# Patient Record
Sex: Female | Born: 1960 | Race: Black or African American | Hispanic: No | Marital: Married | Smoking: Never smoker
Health system: Southern US, Community
[De-identification: ages and names within clinical notes are randomized; demographics above are authoritative.]

## PROBLEM LIST (undated history)

## (undated) DIAGNOSIS — M1990 Unspecified osteoarthritis, unspecified site: Secondary | ICD-10-CM

## (undated) DIAGNOSIS — I10 Essential (primary) hypertension: Secondary | ICD-10-CM

## (undated) DIAGNOSIS — Z8489 Family history of other specified conditions: Secondary | ICD-10-CM

## (undated) HISTORY — PX: ABDOMINAL SURGERY: SHX537

## (undated) HISTORY — PX: CATARACT EXTRACTION: SUR2

## (undated) HISTORY — PX: KNEE ARTHROSCOPY: SUR90

## (undated) HISTORY — PX: ABDOMINAL HYSTERECTOMY: SHX81

---

## 2009-09-24 ENCOUNTER — Emergency Department (HOSPITAL_BASED_OUTPATIENT_CLINIC_OR_DEPARTMENT_OTHER): Admission: EM | Admit: 2009-09-24 | Discharge: 2009-09-24 | Payer: Self-pay | Admitting: Emergency Medicine

## 2009-09-26 ENCOUNTER — Emergency Department (HOSPITAL_BASED_OUTPATIENT_CLINIC_OR_DEPARTMENT_OTHER): Admission: EM | Admit: 2009-09-26 | Discharge: 2009-09-26 | Payer: Self-pay | Admitting: Emergency Medicine

## 2009-09-26 ENCOUNTER — Ambulatory Visit: Payer: Self-pay | Admitting: Radiology

## 2010-07-21 LAB — CULTURE, ROUTINE-ABSCESS

## 2014-11-27 DIAGNOSIS — K219 Gastro-esophageal reflux disease without esophagitis: Secondary | ICD-10-CM | POA: Diagnosis present

## 2014-11-27 DIAGNOSIS — I10 Essential (primary) hypertension: Secondary | ICD-10-CM | POA: Diagnosis present

## 2016-09-30 ENCOUNTER — Other Ambulatory Visit: Payer: Self-pay | Admitting: Neurosurgery

## 2016-10-14 ENCOUNTER — Encounter (HOSPITAL_COMMUNITY)
Admission: RE | Disposition: A | Discharge status: Home / Self Care | Payer: Self-pay | Source: Ambulatory Visit | Attending: Neurosurgery

## 2016-10-14 ENCOUNTER — Ambulatory Visit (HOSPITAL_COMMUNITY)
Admission: RE | Admit: 2016-10-14 | Discharge: 2016-10-14 | Disposition: A | Discharge status: Home / Self Care | Payer: PRIVATE HEALTH INSURANCE | Source: Ambulatory Visit | Attending: Neurosurgery | Admitting: Neurosurgery

## 2016-10-14 ENCOUNTER — Ambulatory Visit (HOSPITAL_COMMUNITY): Payer: PRIVATE HEALTH INSURANCE | Admitting: Anesthesiology

## 2016-10-14 ENCOUNTER — Encounter (HOSPITAL_COMMUNITY): Payer: Self-pay | Admitting: General Practice

## 2016-10-14 DIAGNOSIS — Z79890 Hormone replacement therapy (postmenopausal): Secondary | ICD-10-CM | POA: Diagnosis not present

## 2016-10-14 DIAGNOSIS — G5601 Carpal tunnel syndrome, right upper limb: Secondary | ICD-10-CM | POA: Diagnosis present

## 2016-10-14 DIAGNOSIS — Z7901 Long term (current) use of anticoagulants: Secondary | ICD-10-CM | POA: Diagnosis not present

## 2016-10-14 DIAGNOSIS — I10 Essential (primary) hypertension: Secondary | ICD-10-CM | POA: Insufficient documentation

## 2016-10-14 DIAGNOSIS — Z79899 Other long term (current) drug therapy: Secondary | ICD-10-CM | POA: Diagnosis not present

## 2016-10-14 HISTORY — DX: Family history of other specified conditions: Z84.89

## 2016-10-14 HISTORY — PX: CARPAL TUNNEL RELEASE: SHX101

## 2016-10-14 HISTORY — DX: Unspecified osteoarthritis, unspecified site: M19.90

## 2016-10-14 HISTORY — DX: Essential (primary) hypertension: I10

## 2016-10-14 LAB — CBC
HCT: 43.2 % (ref 36.0–46.0)
Hemoglobin: 14.2 g/dL (ref 12.0–15.0)
MCH: 27.8 pg (ref 26.0–34.0)
MCHC: 32.9 g/dL (ref 30.0–36.0)
MCV: 84.7 fL (ref 78.0–100.0)
Platelets: 259 10*3/uL (ref 150–400)
RBC: 5.1 MIL/uL (ref 3.87–5.11)
RDW: 13.4 % (ref 11.5–15.5)
WBC: 9 10*3/uL (ref 4.0–10.5)

## 2016-10-14 LAB — BASIC METABOLIC PANEL
Anion gap: 10 (ref 5–15)
BUN: 11 mg/dL (ref 6–20)
CO2: 25 mmol/L (ref 22–32)
Calcium: 9.2 mg/dL (ref 8.9–10.3)
Chloride: 104 mmol/L (ref 101–111)
Creatinine, Ser: 0.95 mg/dL (ref 0.44–1.00)
GFR calc Af Amer: >60 mL/min (ref >60)
GFR calc non Af Amer: >60 mL/min (ref >60)
Glucose, Bld: 109 mg/dL — ABNORMAL HIGH (ref 65–99)
Potassium: 3.7 mmol/L (ref 3.5–5.1)
Sodium: 139 mmol/L (ref 135–145)

## 2016-10-14 SURGERY — CARPAL TUNNEL RELEASE
Anesthesia: Monitor Anesthesia Care | Laterality: Right

## 2016-10-14 MED ORDER — CHLORHEXIDINE GLUCONATE CLOTH 2 % EX PADS: 6.0000 | MEDICATED_PAD | Freq: Once | CUTANEOUS | Status: DC

## 2016-10-14 MED ORDER — PROMETHAZINE HCL 25 MG/ML IJ SOLN: 6.2500 mg | INTRAMUSCULAR | Status: DC | PRN

## 2016-10-14 MED ORDER — FENTANYL CITRATE (PF) 250 MCG/5ML IJ SOLN
INTRAMUSCULAR | Status: AC
  Filled 2016-10-14: qty 5

## 2016-10-14 MED ORDER — MIDAZOLAM HCL 2 MG/2ML IJ SOLN
INTRAMUSCULAR | Status: AC
  Filled 2016-10-14: qty 2

## 2016-10-14 MED ORDER — BACITRACIN ZINC 500 UNIT/GM EX OINT
TOPICAL_OINTMENT | CUTANEOUS | Status: AC
  Filled 2016-10-14: qty 28.35

## 2016-10-14 MED ORDER — PROPOFOL 10 MG/ML IV BOLUS
INTRAVENOUS | Status: AC
  Filled 2016-10-14: qty 20

## 2016-10-14 MED ORDER — MIDAZOLAM HCL 2 MG/2ML IJ SOLN: 0.5000 mg | Freq: Once | INTRAMUSCULAR | Status: DC | PRN

## 2016-10-14 MED ORDER — MIDAZOLAM HCL 5 MG/5ML IJ SOLN
INTRAMUSCULAR | Status: DC | PRN
  Administered 2016-10-14: 2 mg via INTRAVENOUS

## 2016-10-14 MED ORDER — 0.9 % SODIUM CHLORIDE (POUR BTL) OPTIME
TOPICAL | Status: DC | PRN
  Administered 2016-10-14: 1000 mL

## 2016-10-14 MED ORDER — ONDANSETRON HCL 4 MG/2ML IJ SOLN
INTRAMUSCULAR | Status: DC | PRN
  Administered 2016-10-14: 4 mg via INTRAVENOUS

## 2016-10-14 MED ORDER — FENTANYL CITRATE (PF) 100 MCG/2ML IJ SOLN
INTRAMUSCULAR | Status: DC | PRN
  Administered 2016-10-14 (×2): 50 ug via INTRAVENOUS

## 2016-10-14 MED ORDER — MEPERIDINE HCL 25 MG/ML IJ SOLN: 6.2500 mg | INTRAMUSCULAR | Status: DC | PRN

## 2016-10-14 MED ORDER — ACETAMINOPHEN-CODEINE #3 300-30 MG PO TABS: 1.0000 | ORAL_TABLET | Freq: Four times a day (QID) | ORAL | 0 refills | Status: DC | PRN

## 2016-10-14 MED ORDER — FENTANYL CITRATE (PF) 100 MCG/2ML IJ SOLN
25.0000 ug | INTRAMUSCULAR | Status: DC | PRN
  Administered 2016-10-14: 25 ug via INTRAVENOUS

## 2016-10-14 MED ORDER — FENTANYL CITRATE (PF) 100 MCG/2ML IJ SOLN
INTRAMUSCULAR | Status: AC
  Filled 2016-10-14: qty 2

## 2016-10-14 MED ORDER — ONDANSETRON HCL 4 MG/2ML IJ SOLN
INTRAMUSCULAR | Status: AC
  Filled 2016-10-14: qty 2

## 2016-10-14 MED ORDER — BACITRACIN ZINC 500 UNIT/GM EX OINT
TOPICAL_OINTMENT | CUTANEOUS | Status: DC | PRN
  Administered 2016-10-14: 1 via TOPICAL

## 2016-10-14 MED ORDER — DEXAMETHASONE SODIUM PHOSPHATE 10 MG/ML IJ SOLN
INTRAMUSCULAR | Status: DC | PRN
  Administered 2016-10-14: 10 mg via INTRAVENOUS

## 2016-10-14 MED ORDER — LIDOCAINE-EPINEPHRINE 1 %-1:100000 IJ SOLN
INTRAMUSCULAR | Status: DC | PRN
  Administered 2016-10-14: 8 mL

## 2016-10-14 MED ORDER — LIDOCAINE HCL (PF) 0.5 % IJ SOLN
INTRAMUSCULAR | Status: AC
  Filled 2016-10-14: qty 50

## 2016-10-14 MED ORDER — LACTATED RINGERS IV SOLN
INTRAVENOUS | Status: DC | PRN
  Administered 2016-10-14: 08:00:00 via INTRAVENOUS

## 2016-10-14 MED ORDER — VANCOMYCIN HCL 1000 MG IV SOLR
INTRAVENOUS | Status: DC | PRN
  Administered 2016-10-14: 1000 mg via INTRAVENOUS

## 2016-10-14 MED ORDER — DEXAMETHASONE SODIUM PHOSPHATE 10 MG/ML IJ SOLN
INTRAMUSCULAR | Status: AC
  Filled 2016-10-14: qty 1

## 2016-10-14 MED ORDER — LIDOCAINE-EPINEPHRINE 1 %-1:100000 IJ SOLN
INTRAMUSCULAR | Status: AC
  Filled 2016-10-14: qty 1

## 2016-10-14 MED ORDER — PROPOFOL 500 MG/50ML IV EMUL
INTRAVENOUS | Status: DC | PRN
  Administered 2016-10-14: 100 ug/kg/min via INTRAVENOUS

## 2016-10-14 SURGICAL SUPPLY — 64 items
BANDAGE ACE 3X5.8 VEL STRL LF (GAUZE/BANDAGES/DRESSINGS) ×3 IMPLANT
BANDAGE GAUZE 4  KLING STR (GAUZE/BANDAGES/DRESSINGS) ×3 IMPLANT
BLADE SURG 15 STRL LF DISP TIS (BLADE) ×1 IMPLANT
BLADE SURG 15 STRL SS (BLADE) ×2
BNDG ELASTIC 2X5.8 VLCR STR LF (GAUZE/BANDAGES/DRESSINGS) ×3 IMPLANT
BNDG GAUZE ELAST 4 BULKY (GAUZE/BANDAGES/DRESSINGS) ×3 IMPLANT
CARTRIDGE OIL MAESTRO DRILL (MISCELLANEOUS) ×1 IMPLANT
CORDS BIPOLAR (ELECTRODE) ×3 IMPLANT
DECANTER SPIKE VIAL GLASS SM (MISCELLANEOUS) ×3 IMPLANT
DERMABOND ADVANCED (GAUZE/BANDAGES/DRESSINGS) ×2
DERMABOND ADVANCED .7 DNX12 (GAUZE/BANDAGES/DRESSINGS) ×1 IMPLANT
DIFFUSER DRILL AIR PNEUMATIC (MISCELLANEOUS) ×3 IMPLANT
DRAPE EXTREMITY T 121X128X90 (DRAPE) ×3 IMPLANT
DRAPE HALF SHEET 40X57 (DRAPES) ×3 IMPLANT
DURAPREP 26ML APPLICATOR (WOUND CARE) ×3 IMPLANT
GAUZE SPONGE 4X4 12PLY STRL (GAUZE/BANDAGES/DRESSINGS) ×3 IMPLANT
GAUZE SPONGE 4X4 16PLY XRAY LF (GAUZE/BANDAGES/DRESSINGS) ×3 IMPLANT
GLOVE BIO SURGEON STRL SZ 6.5 (GLOVE) IMPLANT
GLOVE BIO SURGEON STRL SZ7 (GLOVE) IMPLANT
GLOVE BIO SURGEON STRL SZ7.5 (GLOVE) IMPLANT
GLOVE BIO SURGEON STRL SZ8 (GLOVE) IMPLANT
GLOVE BIO SURGEON STRL SZ8.5 (GLOVE) IMPLANT
GLOVE BIO SURGEONS STRL SZ 6.5 (GLOVE)
GLOVE BIOGEL M 8.0 STRL (GLOVE) IMPLANT
GLOVE ECLIPSE 6.5 STRL STRAW (GLOVE) ×3 IMPLANT
GLOVE ECLIPSE 7.0 STRL STRAW (GLOVE) IMPLANT
GLOVE ECLIPSE 7.5 STRL STRAW (GLOVE) IMPLANT
GLOVE ECLIPSE 8.0 STRL XLNG CF (GLOVE) IMPLANT
GLOVE ECLIPSE 8.5 STRL (GLOVE) IMPLANT
GLOVE EXAM NITRILE LRG STRL (GLOVE) IMPLANT
GLOVE EXAM NITRILE XL STR (GLOVE) IMPLANT
GLOVE EXAM NITRILE XS STR PU (GLOVE) IMPLANT
GLOVE INDICATOR 6.5 STRL GRN (GLOVE) IMPLANT
GLOVE INDICATOR 7.0 STRL GRN (GLOVE) ×3 IMPLANT
GLOVE INDICATOR 7.5 STRL GRN (GLOVE) ×6 IMPLANT
GLOVE INDICATOR 8.0 STRL GRN (GLOVE) IMPLANT
GLOVE INDICATOR 8.5 STRL (GLOVE) IMPLANT
GLOVE OPTIFIT SS 8.0 STRL (GLOVE) IMPLANT
GLOVE SS BIOGEL STRL SZ 7 (GLOVE) ×3 IMPLANT
GLOVE SUPERSENSE BIOGEL SZ 7 (GLOVE) ×6
GLOVE SURG SS PI 6.5 STRL IVOR (GLOVE) IMPLANT
GLOVE SURG SS PI 7.0 STRL IVOR (GLOVE) ×6 IMPLANT
GOWN STRL REUS W/ TWL LRG LVL3 (GOWN DISPOSABLE) ×2 IMPLANT
GOWN STRL REUS W/ TWL XL LVL3 (GOWN DISPOSABLE) ×1 IMPLANT
GOWN STRL REUS W/TWL 2XL LVL3 (GOWN DISPOSABLE) IMPLANT
GOWN STRL REUS W/TWL LRG LVL3 (GOWN DISPOSABLE) ×4
GOWN STRL REUS W/TWL XL LVL3 (GOWN DISPOSABLE) ×2
KIT BASIN OR (CUSTOM PROCEDURE TRAY) ×3 IMPLANT
KIT ROOM TURNOVER OR (KITS) ×3 IMPLANT
NEEDLE HYPO 25X1 1.5 SAFETY (NEEDLE) ×3 IMPLANT
NS IRRIG 1000ML POUR BTL (IV SOLUTION) ×3 IMPLANT
OIL CARTRIDGE MAESTRO DRILL (MISCELLANEOUS) ×3
PACK SURGICAL SETUP 50X90 (CUSTOM PROCEDURE TRAY) ×3 IMPLANT
PAD ARMBOARD 7.5X6 YLW CONV (MISCELLANEOUS) ×9 IMPLANT
STOCKINETTE 4X48 STRL (DRAPES) ×3 IMPLANT
SUT ETHILON 3 0 PS 1 (SUTURE) ×3 IMPLANT
SYR BULB 3OZ (MISCELLANEOUS) ×3 IMPLANT
SYR CONTROL 10ML LL (SYRINGE) ×3 IMPLANT
TOWEL GREEN STERILE (TOWEL DISPOSABLE) ×3 IMPLANT
TOWEL GREEN STERILE FF (TOWEL DISPOSABLE) ×3 IMPLANT
TUBE CONNECTING 12'X1/4 (SUCTIONS) ×1
TUBE CONNECTING 12X1/4 (SUCTIONS) ×2 IMPLANT
UNDERPAD 30X30 (UNDERPADS AND DIAPERS) ×3 IMPLANT
WATER STERILE IRR 1000ML POUR (IV SOLUTION) ×3 IMPLANT

## 2016-10-14 NOTE — H&P (Signed)
Lynn Hodges is a 56 y.o. female Who presents with right carpal tunnel syndrome.  Allergies  Allergen Reactions  . Penicillins Nausea And Vomiting and Rash   Past Medical History:  Diagnosis Date  . Arthritis   . Family history of adverse reaction to anesthesia    malignant hyperthermia with 2 cousins and mother's sister  . Hypertension    Past Surgical History:  Procedure Laterality Date  . ABDOMINAL HYSTERECTOMY    . ABDOMINAL SURGERY    . CATARACT EXTRACTION Right   . CESAREAN SECTION     x3  . KNEE ARTHROSCOPY Left    History reviewed. No pertinent family history. Social History   Social History  . Marital status: Married    Spouse name: N/A  . Number of children: N/A  . Years of education: N/A   Occupational History  . Not on file.   Social History Main Topics  . Smoking status: Never Smoker  . Smokeless tobacco: Never Used  . Alcohol use Not on file  . Drug use: Unknown  . Sexual activity: Not on file   Other Topics Concern  . Not on file   Social History Narrative  . No narrative on file   Physical Exam  Constitutional: She is oriented to person, place, and time. She appears well-developed and well-nourished. No distress.  HENT:  Head: Normocephalic and atraumatic.  Right Ear: External ear normal.  Left Ear: External ear normal.  Eyes: Conjunctivae are normal. Pupils are equal, round, and reactive to light.  Right lateral strabismus right eye  Neck: Normal range of motion. Neck supple.  Cardiovascular: Normal rate and regular rhythm.   Pulmonary/Chest: Effort normal and breath sounds normal.  Abdominal: Soft. Bowel sounds are normal.  Musculoskeletal: Normal range of motion.  Neurological: She is oriented to person, place, and time. She displays normal reflexes. A sensory deficit is present. No cranial nerve deficit. She exhibits normal muscle tone. Coordination normal.  Skin: Skin is warm and dry.  Psychiatric: She has a normal mood and affect.  Her behavior is normal. Judgment and thought content normal.   A/p  Admit for carpal tunnel release. Right side Risks and benefits bleeding infection no relief, need for further surgery, nerve damage, hand weakness, and other risks were discussed. She understands and wishes to proceed.

## 2016-10-14 NOTE — Anesthesia Postprocedure Evaluation (Signed)
Anesthesia Post Note  Patient: FALAN HENSLER  Procedure(s) Performed: Procedure(s) (LRB): CARPAL TUNNEL RELEASE (Right)     Patient location during evaluation: PACU Anesthesia Type: MAC Level of consciousness: awake and alert and patient cooperative Pain management: pain level controlled Vital Signs Assessment: post-procedure vital signs reviewed and stable Respiratory status: spontaneous breathing, nonlabored ventilation and respiratory function stable Cardiovascular status: blood pressure returned to baseline and stable Postop Assessment: no signs of nausea or vomiting Anesthetic complications: no Comments: Discussed need for MH testing with patient and her family.  She understands the importance of testing for her family. Contact info for the Dept of Anesthesiology at [facility], which is a testing site, given to patient and her family. All questions answered      Last Vitals:  Vitals:   10/14/16 1113 10/14/16 1116  BP:  (!) 156/83  Pulse: 72 72  Resp: (!) 21 19  Temp: (!) 36.1 C     Last Pain:  Vitals:   10/14/16 1103  TempSrc:   PainSc: 6                  ,E. 

## 2016-10-14 NOTE — Anesthesia Preprocedure Evaluation (Signed)
Anesthesia Evaluation  Patient identified by MRN, date of birth, ID band Patient awake    Reviewed: Allergy & Precautions, NPO status , Patient's Chart, lab work & pertinent test results  History of Anesthesia Complications (+) MALIGNANT HYPERTHERMIA, Family history of anesthesia reaction and history of anesthetic complications (2 maternal cousins have had MH)  Airway Mallampati: I  TM Distance: >3 FB Neck ROM: Full    Dental  (+) Dental Advisory Given   Pulmonary neg pulmonary ROS,    breath sounds clear to auscultation       Cardiovascular hypertension, Pt. on medications (-) anginanegative cardio ROS   Rhythm:Regular Rate:Normal     Neuro/Psych negative neurological ROS     GI/Hepatic Neg liver ROS, GERD  Medicated and Controlled,  Endo/Other  Morbid obesity  Renal/GU negative Renal ROS     Musculoskeletal   Abdominal (+) + obese,   Peds  Hematology   Anesthesia Other Findings   Reproductive/Obstetrics                             Anesthesia Physical Anesthesia Plan  ASA: II  Anesthesia Plan: MAC   Post-op Pain Management:    Induction: Intravenous  PONV Risk Score and Plan: 2 and Ondansetron and Dexamethasone  Airway Management Planned: Natural Airway and Nasal Cannula  Additional Equipment:   Intra-op Plan:   Post-operative Plan:   Informed Consent: I have reviewed the patients History and Physical, chart, labs and discussed the procedure including the risks, benefits and alternatives for the proposed anesthesia with the patient or authorized representative who has indicated his/her understanding and acceptance.   Dental advisory given  Plan Discussed with: CRNA and Surgeon  Anesthesia Plan Comments: (Plan routine monitors, MAC)        Anesthesia Quick Evaluation

## 2016-10-14 NOTE — Discharge Instructions (Signed)
Pt to take compression wrap off tommorow, take gauze off on Friday then may get wet but do not summerge in water,  Make follow up appt for 10 days       Carpal Tunnel Release (Repair)  Care After    Refer to this sheet in the next few weeks. These discharge instructions provide you with general information on caring for yourself after you leave the hospital. Your caregiver may also give you specific instructions. Your treatment has been planned according to the most current medical practices available, but unavoidable complications sometimes occur. If you have any problems or questions after discharge, please call your caregiver.  HOME CARE INSTRUCTIONS  Have a responsible person with you for 24 hours.  Do not drive a car or take public transportation for 24 hours.  Only take over-the-counter or prescription medicines for pain, discomfort, or fever as directed by your caregiver. Take them as directed.  You may put ice on the palm side of the affected wrist.  Put ice in a plastic bag.  Place a towel between your skin and the bag.  Leave the ice on for 15 to 20 minutes, 3 to 4 times per day.  Remove the Ace bandage approximately 6pm the day of surgery Keep your hand raised (elevated) above the level of your heart as much as possible. This keeps swelling down and helps with discomfort.  Change bandages (dressings) as directed.  Keep the wound clean and dry. SEEK MEDICAL CARE IF:  You develop pain not relieved with medicines.  You develop numbness of your hand.  You develop bleeding from your surgical site.  You have an oral temperature above 102 F (38.9 C).  You develop redness or swelling of the surgical site.  You develop new, unexplained problems. SEEK IMMEDIATE MEDICAL CARE IF:  You develop a rash.  You have difficulty breathing.  You develop any reaction or side effects to medicines given. MAKE SURE YOU:  Understand these instructions.  Will watch your condition.  Will get  help right away if you are not doing well or get worse.

## 2016-10-14 NOTE — Transfer of Care (Signed)
Immediate Anesthesia Transfer of Care Note  Patient: Lynn Hodges  Procedure(s) Performed: Procedure(s) with comments: CARPAL TUNNEL RELEASE (Right) - CARPAL TUNNEL RELEASE  Patient Location: PACU  Anesthesia Type:MAC  Level of Consciousness: awake, alert , oriented and patient cooperative  Airway & Oxygen Therapy: Patient Spontanous Breathing and Patient connected to nasal cannula oxygen  Post-op Assessment: Report given to RN and Post -op Vital signs reviewed and stable  Post vital signs: Reviewed and stable  Last Vitals:  Vitals:   10/14/16 0715 10/14/16 0717  BP: (!) 189/111 (!) 170/87  Pulse: 73   Resp: 18   Temp: 36.8 C     Last Pain:  Vitals:   10/14/16 0715  TempSrc: Oral  PainSc:       Patients Stated Pain Goal: 2 (17/61/60 7371)  Complications: No apparent anesthesia complications

## 2016-10-14 NOTE — Op Note (Signed)
   10:36 AM  PATIENT:  Lynn Hodges  56 y.o. female  PRE-OPERATIVE DIAGNOSIS:  Right Carpal Tunnel Syndrome  POST-OPERATIVE DIAGNOSIS:  Right Carpal Tunnel Syndrome  PROCEDURE:  Procedure(s):right CARPAL TUNNEL RELEASE  SURGEON: Surgeon(s): Coletta Memosabbell, , MD  ANESTHESIA:   local and IV sedation  EBL:  Total I/O In: 950 [I.V.:700; IV Piggyback:250] Out: 10 [Blood:10]  COUNT:per nursing  DICTATION: Lynn FootJeannie M Strick was taken to the operating room, given IV sedation, and positioned on the operating room table. female had their right upper extremity prepped and draped in a sterile manner. I infiltrated Licodcaine  1/2% 1/200,000 strength epinephrine into the planned incision starting at the proximal palmar crease extending into the hand ~ 1.5cm. I opened the skin with a 15 blade and extended the incision through the skin into the subcutaneous tissue. I used the bipolar cautery to control the subcutaneous bleeding. I dissected sharply through the tissue using forceps also to expose the transverse carpal ligament. I dived the transverse carpal ligament sharply with the 15 blade using the forceps to protect the contents of the carpal tunnel. With the scissors I divided the ligament both proximally and distally to decompress the entire carpal tunnel. I used the scissors to dissect into the forearm to create space to divide the ligament to the proximal palmar crease, and distally into the palm.  I irrigated the wound then closed the incision with vertical interrupted vertical mattress sutures. I placed a sterile dressing, then wrapped the proximal hand and distal forearm with an ace wrap.  PLAN OF CARE: Discharge to home after PACU  PATIENT DISPOSITION:  PACU - hemodynamically stable.   Delay start of Pharmacological VTE agent (>24hrs) due to surgical blood loss or risk of bleeding:  yes

## 2016-10-14 NOTE — Progress Notes (Signed)
Dr. Jean RosenthalJackson notified of family history of malignant hyperthermia on mother's side.  Dr. Jean RosenthalJackson made aware and asked nurse to [facility].  Unable to reach [facility].  Carol AdaKristie [facility], charge CRNA aware, anesthesia tech made aware and setting up OR.

## 2016-10-15 ENCOUNTER — Encounter (HOSPITAL_COMMUNITY): Payer: Self-pay | Admitting: Neurosurgery

## 2016-11-26 ENCOUNTER — Telehealth: Payer: Self-pay | Admitting: Physical Therapy

## 2016-11-26 NOTE — Telephone Encounter (Signed)
Patient calling back RE scheduling new pt appt with Judeth CornfieldStephanie for PT.  Say's she will call back to sched on her lunch break.  Thank you,  -LL

## 2016-11-30 ENCOUNTER — Ambulatory Visit (INDEPENDENT_AMBULATORY_CARE_PROVIDER_SITE_OTHER): Payer: PRIVATE HEALTH INSURANCE | Admitting: Physical Therapy

## 2016-11-30 DIAGNOSIS — M25631 Stiffness of right wrist, not elsewhere classified: Secondary | ICD-10-CM | POA: Diagnosis not present

## 2016-11-30 DIAGNOSIS — M6281 Muscle weakness (generalized): Secondary | ICD-10-CM | POA: Diagnosis not present

## 2016-11-30 DIAGNOSIS — M25531 Pain in right wrist: Secondary | ICD-10-CM | POA: Diagnosis not present

## 2016-11-30 NOTE — Therapy (Signed)
[facility] [address] [place], [place], [address] Phone: [phone]   Fax:  [phone]  Physical Therapy Evaluation  Patient Details  Name: Lynn Hodges MRN: [id number] Date of Birth: 10/20/1960 Referring Provider: Dr. Coletta MemosKyle Cabbell  Encounter Date: 11/30/2016      PT End of Session - 11/30/16 0931    Visit Number 1   Number of Visits 4   Date for PT Re-Evaluation 12/28/16   PT Start Time 0855   PT Stop Time 0925   PT Time Calculation (min) 30 min   Activity Tolerance Patient tolerated treatment well   Behavior During Therapy [facility] for tasks assessed/performed      Past Medical History:  Diagnosis Date  . Arthritis   . Family history of adverse reaction to anesthesia    malignant hyperthermia with 2 cousins and mother's sister  . Hypertension     Past Surgical History:  Procedure Laterality Date  . ABDOMINAL HYSTERECTOMY    . ABDOMINAL SURGERY    . CARPAL TUNNEL RELEASE Right 10/14/2016   Procedure: CARPAL TUNNEL RELEASE;  Surgeon: Coletta Memosabbell, Kyle, MD;  [facility];  Service: Neurosurgery;  Laterality: Right;  CARPAL TUNNEL RELEASE  . CATARACT EXTRACTION Right   . CESAREAN SECTION     x3  . KNEE ARTHROSCOPY Left     There were no vitals filed for this visit.       Subjective Assessment - 11/30/16 0858    Subjective Pt is a 56 y/o female who presents to OPPT s/p Rt carpal tunnel release on 10/14/16.  Pt presents today with continued pain most consistent with activities including ulnar deviation as well as pronation/supination.   Patient is accompained by: Family member  son   Pertinent History arthritis, HTN   Limitations House hold activities   Patient Stated Goals improve pain and function   Currently in Pain? Yes   Pain Score 5    Pain Location Hand   Pain Orientation Right   Pain Descriptors / Indicators Numbness;Shooting;Tightness   Pain Type Surgical pain;Chronic pain;Neuropathic pain   Pain Onset  More than a month ago   Pain Frequency Intermittent   Aggravating Factors  opening jars/bottles, stirring            [facility] PT Assessment - 11/30/16 0902      Assessment   Medical Diagnosis Rt CTR   Referring Provider Dr. Coletta MemosKyle Cabbell   Onset Date/Surgical Date 10/14/16   Hand Dominance Right   Next MD Visit 12/31/16   Prior Therapy none for this condition     Precautions   Precaution Comments soft wrist orthosis; no lifting > 10#     Restrictions   Weight Bearing Restrictions No     Balance Screen   Has the patient fallen in the past 6 months No   Has the patient had a decrease in activity level because of a fear of falling?  No   Is the patient reluctant to leave their home because of a fear of falling?  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   Leisure sleep, shop, church     Cognition  Overall Cognitive Status Within Functional Limits for tasks assessed     Posture/Postural Control   Posture/Postural Control Postural limitations   Postural Limitations Rounded Shoulders;Forward head     AROM   AROM Assessment Site Wrist   Right/Left Wrist Right   Right Wrist Extension 50 Degrees   Right Wrist Flexion 60 Degrees   Right Wrist Radial Deviation 16 Degrees   Right Wrist Ulnar Deviation 46 Degrees     Strength   Strength Assessment Site Wrist;Hand;Forearm   Right/Left Forearm Right   Right Forearm Pronation 3+/5   Right Forearm Supination 3+/5   Right/Left Wrist Right   Right Wrist Flexion 3-/5   Right Wrist Extension 3+/5   Right Wrist Radial Deviation 5/5   Right Wrist Ulnar Deviation 3/5   Right/Left hand Right;Left   Right  Hand Grip (lbs) 17.67  24, 15, 14   Left Hand Grip (lbs) 56.33  69, 55, 45     Palpation   Palpation comment mild tenderness at Rt wrist joint ant/post; swelling noted Rt wrist            Objective measurements completed on examination: See above findings.          [facility] Adult PT Treatment/Exercise - 11/30/16 0902      Exercises   Exercises Wrist;Hand     Hand Exercises   Thumb Opposition to all digits x 5 reps on Rt   Other Hand Exercises towel squeeze 5x5 sec; open/close fist for edema management     Wrist Exercises   Other wrist exercises prayer and reverse prayer stretch x 30 sec each for HEP instruction                PT Education - 11/30/16 0931    Education provided Yes   Education Details HEP   Person(s) Educated Patient;Child(ren)   Methods Explanation;Demonstration;Handout   Comprehension Verbalized understanding;Returned demonstration             PT Long Term Goals - 11/30/16 1058      PT LONG TERM GOAL #1   Title independent with HEP    Time 4   Period Weeks   Status New   Target Date 12/28/16     PT LONG TERM GOAL #2   Title report pain < 3/10 with activity for improved work tolerance   Time 4   Period Weeks   Status New   Target Date 12/28/16     PT LONG TERM GOAL #3   Title improve Rt grip strength to > 20# for improved strength and ADLs   Time 4   Period Weeks   Status New   Target Date 12/28/16                Plan - 11/30/16 1056    Clinical Impression Statement Pt is a 56 y/o female who presents to OPPT s/p Rt CTR.  Pt demonstrates decreased strength and ROM, pain and swelling affecting functional mobility.  Pt will benefit from PT to address deficits listed.   Clinical Presentation Stable   Clinical Decision Making Low   Rehab Potential Good   PT Frequency 1x / week   PT Duration 4 weeks   PT Treatment/Interventions ADLs/Self Care Home Management;Cryotherapy;Moist Heat;Ultrasound;Therapeutic  exercise;Therapeutic activities;Functional mobility training;Patient/family education;Manual techniques;Scar mobilization;Taping;Dry needling;Passive range of motion   PT Next Visit Plan review HEP, manual PRN for motion, add strengthening exercises to HEP   Consulted and Agree with Plan of Care Patient;Family member/caregiver  Family Member Consulted son      Patient will benefit from skilled therapeutic intervention in order to improve the following deficits and impairments:  Decreased range of motion, Decreased strength, Pain, Impaired UE functional use, Increased edema, Increased muscle spasms  Visit Diagnosis: Pain in right wrist - Plan: PT plan of care cert/re-cert  Stiffness of right wrist, not elsewhere classified - Plan: PT plan of care cert/re-cert  Muscle weakness (generalized) - Plan: PT plan of care cert/re-cert     Problem List There are no active problems to display for this patient.    Clarita Crane, PT, DPT 11/30/16 11:04 AM   [facility] [address] [place], [place], [address] Phone: [phone]   Fax:  [phone]  Name: Lynn Hodges MRN: [id number] Date of Birth: 1960-11-01

## 2016-12-07 ENCOUNTER — Ambulatory Visit (INDEPENDENT_AMBULATORY_CARE_PROVIDER_SITE_OTHER): Payer: PRIVATE HEALTH INSURANCE | Admitting: Physical Therapy

## 2016-12-07 DIAGNOSIS — M25531 Pain in right wrist: Secondary | ICD-10-CM | POA: Diagnosis not present

## 2016-12-07 DIAGNOSIS — M6281 Muscle weakness (generalized): Secondary | ICD-10-CM

## 2016-12-07 DIAGNOSIS — M25631 Stiffness of right wrist, not elsewhere classified: Secondary | ICD-10-CM | POA: Diagnosis not present

## 2016-12-07 NOTE — Therapy (Addendum)
[place] [address] [place], [place], [address] Phone: [phone]   Fax:  [phone]  Physical Therapy Treatment/Discharge  Patient Details  Name: Lynn Hodges MRN: [id number] Date of Birth: 1960/12/11 Referring Provider: Dr. Ashok Pall  Encounter Date: 12/07/2016      PT End of Session - 12/07/16 1338    Visit Number 2   Number of Visits 4   Date for PT Re-Evaluation 12/28/16   PT Start Time 5462   PT Stop Time 1334   PT Time Calculation (min) 39 min   Activity Tolerance Patient tolerated treatment well   Behavior During Therapy [facility] for tasks assessed/performed      Past Medical History:  Diagnosis Date  . Arthritis   . Family history of adverse reaction to anesthesia    malignant hyperthermia with 2 cousins and mother's sister  . Hypertension     Past Surgical History:  Procedure Laterality Date  . ABDOMINAL HYSTERECTOMY    . ABDOMINAL SURGERY    . CARPAL TUNNEL RELEASE Right 10/14/2016   Procedure: CARPAL TUNNEL RELEASE;  Surgeon: Ashok Pall, MD;  Location: [facility];  Service: Neurosurgery;  Laterality: Right;  CARPAL TUNNEL RELEASE  . CATARACT EXTRACTION Right   . CESAREAN SECTION     x3  . KNEE ARTHROSCOPY Left     There were no vitals filed for this visit.      Subjective Assessment - 12/07/16 1258    Subjective still having difficulty with Rt thumb, but otherwise doing well.  Wrist is feeling better.   Patient Stated Goals improve pain and function   Currently in Pain? Yes   Pain Score 4    Pain Location Finger (Comment which one)  thumb   Pain Orientation Right   Pain Descriptors / Indicators Tender;Numbness;Sore   Pain Type Surgical pain;Chronic pain;Neuropathic pain   Pain Onset More than a month ago   Pain Frequency Intermittent   Aggravating Factors  stirring   Pain Relieving Factors ibuprofen                         [facility] Adult PT Treatment/Exercise - 12/07/16 1335       Elbow Exercises   Forearm Supination Right;15 reps;Bar weights/barbell  pronation/supination x 15 reps each; Rt; 2#   Wrist Flexion Right;15 reps;Bar weights/barbell   Bar Weights/Barbell (Wrist Flexion) 2 lbs   Wrist Extension Right;15 reps;Bar weights/barbell   Bar Weights/Barbell (Wrist Extension) 2 lbs     Hand Exercises   Rubberbands thumb extension x 15; digit extension x 15   Other Hand Exercises ASL alphabet x 1 rep through     Wrist Exercises   Wrist Radial Deviation Right;15 reps;Bar weights/barbell   Bar Weights/Barbell (Radial Deviation) 2 lbs   Wrist Ulnar Deviation Right;15 reps;Bar weights/barbell   Bar Weights/Barbell (Ulnar Deviation) 2 lbs     Manual Therapy   Manual Therapy Edema management;Soft tissue mobilization;Passive ROM   Edema Management Rt hand/wrist   Soft tissue mobilization Rt wrist flexors and intrinsic thumb muscles   Passive ROM Rt wrist all directions                PT Education - 12/07/16 1338    Education provided Yes   Education Details strengthening HEP   Person(s) Educated Patient;Child(ren)   Methods Explanation;Demonstration;Handout   Comprehension Verbalized understanding;Returned demonstration             PT Long Term Goals -  12/07/16 1339      PT LONG TERM GOAL #1   Title independent with HEP    Baseline 12/07/16: independent with initial HEP, new HEP given today   Status On-going     PT LONG TERM GOAL #2   Title report pain < 3/10 with activity for improved work tolerance   Status On-going     PT LONG TERM GOAL #3   Title improve Rt grip strength to > 20# for improved strength and ADLs   Status On-going               Plan - 12/07/16 1339    Clinical Impression Statement Pt independent with ROM HEP and initiated strengthening exercises today.  Tolerated session well with min c/o muscle fatigue with strengthening.  Continues to have some tightness and tenderness in intrinsic thumb muscles and  wrist flexors, and educated on scar mobilization.  Will plan to see next week, check goals and possibly hold PT if pt doing well.   PT Treatment/Interventions ADLs/Self Care Home Management;Cryotherapy;Moist Heat;Ultrasound;Therapeutic exercise;Therapeutic activities;Functional mobility training;Patient/family education;Manual techniques;Scar mobilization;Taping;Dry needling;Passive range of motion   PT Next Visit Plan review HEP, manual PRN for motion, check grip strength.  pt may request to hold PT x 30 days   Consulted and Agree with Plan of Care Patient;Family member/caregiver   Family Member Consulted son      Patient will benefit from skilled therapeutic intervention in order to improve the following deficits and impairments:  Decreased range of motion, Decreased strength, Pain, Impaired UE functional use, Increased edema, Increased muscle spasms  Visit Diagnosis: Pain in right wrist  Stiffness of right wrist, not elsewhere classified  Muscle weakness (generalized)     Problem List There are no active problems to display for this patient.     Laureen Abrahams, PT, DPT 12/07/16 1:44 PM    [place], [place], [address] Phone: [phone]   Fax:  [phone]  Name: Lynn Hodges MRN: [id number] Date of Birth: Feb 23, 1961       PHYSICAL THERAPY DISCHARGE SUMMARY  Visits from Start of Care: 2  Current functional level related to goals / functional outcomes: See above   Remaining deficits: Unknown; pt didn't return after 2nd visit.     Education / Equipment: HEP  Plan: Patient agrees to discharge.  Patient goals were not met. Patient is being discharged due to not returning since the last visit.  ?????     Laureen Abrahams, PT, DPT 02/01/17 7:47 AM   [place] [address] [place], [place], [address] Phone: [phone]  Fax:  [phone]

## 2016-12-07 NOTE — Patient Instructions (Signed)
Flexion (Resistive)    With hand palm-up and holding _16-32___ ounces, bend hand toward you at wrist. Hold __1-2__ seconds. Relax slowly. Repeat _15___ times. Do _1-2___ sessions per day.   Extension (Resistive)    With wrist over edge of table, lift _16-32___ ounces, keeping arm on table surface. Hold __1-2__ seconds. Lower slowly. Repeat __15__ times. Do __1-2__ sessions per day.   Forearm Pronation / Supination: Resisted (Sitting)    With right forearm supported, grasp object and gently rotate palm up, then down, as far as possible without pain. Repeat _15___ times per set. Do __1__ sets per session. Do __1-2__ sessions per day.                  Deviation, Radial: Hammer    Left forearm on thigh, wrist extending off knee, thumb up, hold one end of __1-2__ lb weight. Lower front end of weight as if hammering. Repeat __15__ times per set. Do __1__ sets per session. Do __1-2__ sessions per week.   Finger Extension / Thumb Abduction: Resisted    With rubber band around right thumb and _all 4_ fingers, hand slightly cupped, gently spread thumb and fingers apart.  Also perform just moving the thumb away.   Repeat __15__ times per set. Do __1__ sets per session. Do _1-2___ sessions per day.

## 2017-06-15 ENCOUNTER — Ambulatory Visit (INDEPENDENT_AMBULATORY_CARE_PROVIDER_SITE_OTHER): Payer: PRIVATE HEALTH INSURANCE

## 2017-06-15 ENCOUNTER — Other Ambulatory Visit: Payer: Self-pay | Admitting: Neurosurgery

## 2017-06-15 DIAGNOSIS — R2231 Localized swelling, mass and lump, right upper limb: Secondary | ICD-10-CM

## 2017-06-15 DIAGNOSIS — M25541 Pain in joints of right hand: Secondary | ICD-10-CM

## 2018-03-21 DIAGNOSIS — M4802 Spinal stenosis, cervical region: Secondary | ICD-10-CM | POA: Insufficient documentation

## 2018-04-05 DIAGNOSIS — F411 Generalized anxiety disorder: Secondary | ICD-10-CM | POA: Diagnosis present

## 2018-07-03 DIAGNOSIS — M5030 Other cervical disc degeneration, unspecified cervical region: Secondary | ICD-10-CM | POA: Insufficient documentation

## 2018-07-03 DIAGNOSIS — G894 Chronic pain syndrome: Secondary | ICD-10-CM | POA: Insufficient documentation

## 2018-07-03 DIAGNOSIS — M4712 Other spondylosis with myelopathy, cervical region: Secondary | ICD-10-CM | POA: Insufficient documentation

## 2018-09-29 IMAGING — DX DG HAND 2V*R*
2 series · 2 of 2 positions shown · non-contrast
Comparison: None.

CLINICAL DATA: 56-year-old female with a history of pain at the
base of the right thumb into the wrist.

EXAM:
RIGHT HAND - 2 VIEW

[hand pa]
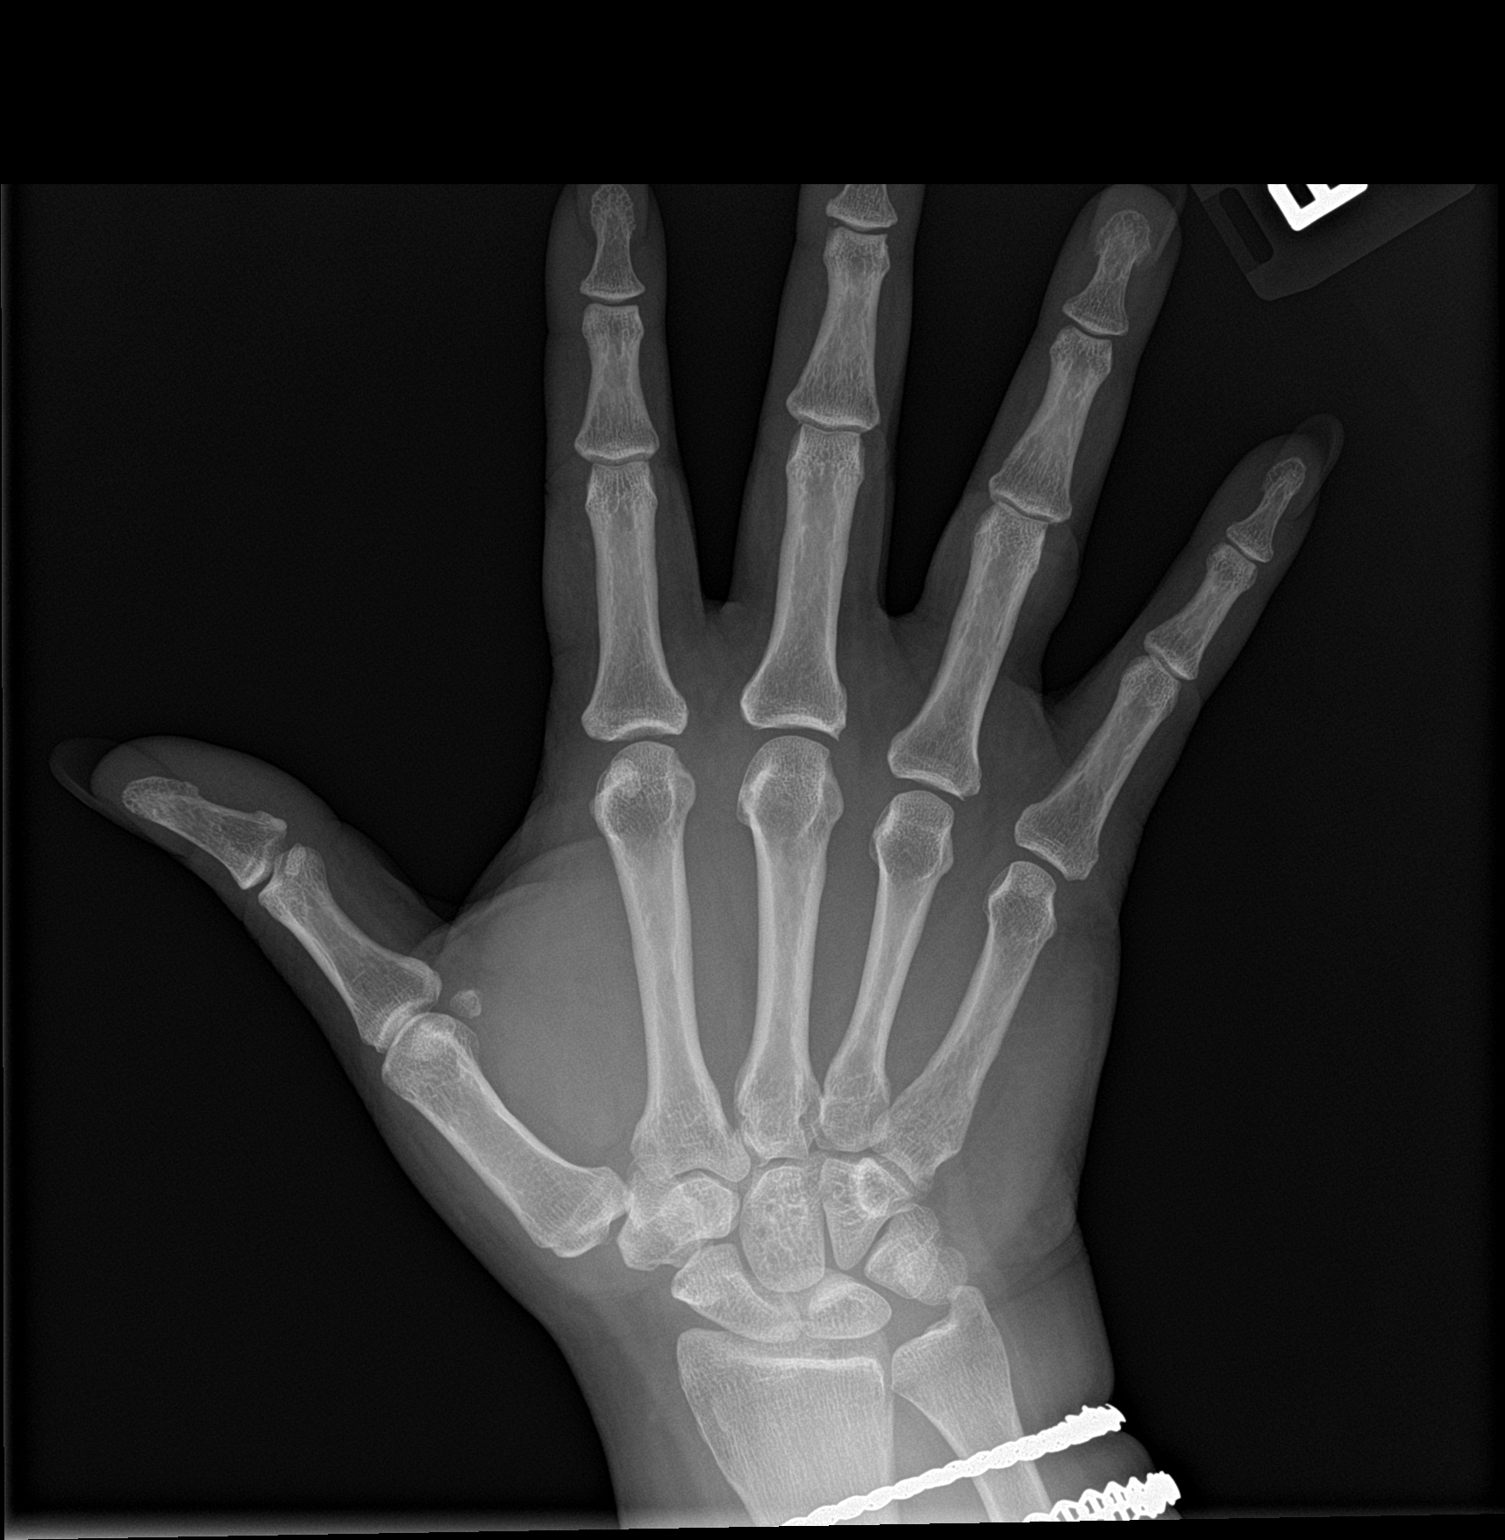

[hand lat]
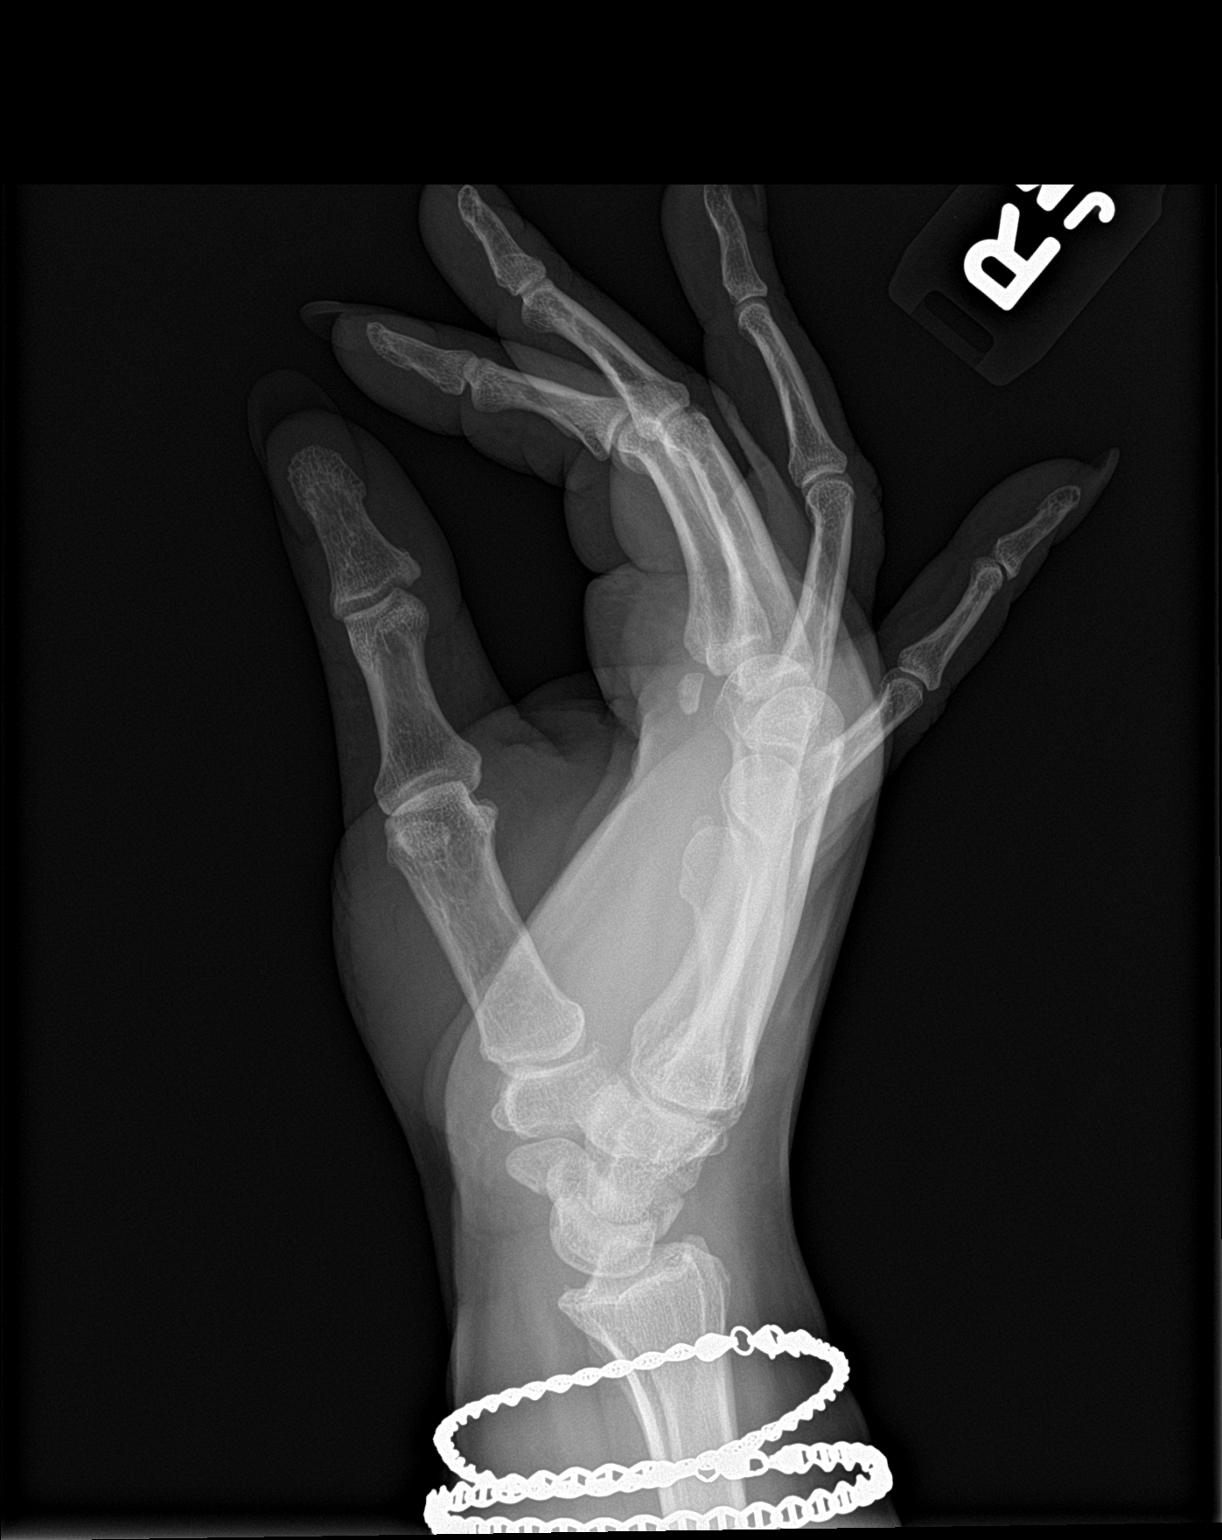

[2 of 2 positions shown; findings below may reference images not displayed]

FINDINGS: No acute displaced fracture. No significant degenerative changes. No
periarticular osteopenia. No subluxation/dislocation. No erosive
changes. Minimal degenerative changes at the first carpometacarpal
joint. No focal soft tissue swelling. No radiopaque foreign body.
IMPRESSION: Negative for acute bony abnormality.

Minimal degenerative changes at the first carpometacarpal joint.

## 2019-11-30 DIAGNOSIS — I252 Old myocardial infarction: Secondary | ICD-10-CM | POA: Insufficient documentation

## 2019-11-30 DIAGNOSIS — E785 Hyperlipidemia, unspecified: Secondary | ICD-10-CM | POA: Diagnosis present

## 2019-11-30 DIAGNOSIS — I2510 Atherosclerotic heart disease of native coronary artery without angina pectoris: Secondary | ICD-10-CM | POA: Diagnosis present

## 2019-12-13 DIAGNOSIS — Z955 Presence of coronary angioplasty implant and graft: Secondary | ICD-10-CM

## 2022-12-01 ENCOUNTER — Other Ambulatory Visit: Payer: Self-pay

## 2022-12-01 ENCOUNTER — Inpatient Hospital Stay (HOSPITAL_BASED_OUTPATIENT_CLINIC_OR_DEPARTMENT_OTHER)
Admission: EM | Admit: 2022-12-01 | Discharge: 2022-12-04 | DRG: 177 | Disposition: A | Discharge status: Home / Self Care | Payer: Managed Care, Other (non HMO) | Attending: Internal Medicine | Admitting: Internal Medicine

## 2022-12-01 ENCOUNTER — Emergency Department (HOSPITAL_BASED_OUTPATIENT_CLINIC_OR_DEPARTMENT_OTHER): Payer: Managed Care, Other (non HMO)

## 2022-12-01 ENCOUNTER — Encounter (HOSPITAL_BASED_OUTPATIENT_CLINIC_OR_DEPARTMENT_OTHER): Payer: Self-pay | Admitting: Emergency Medicine

## 2022-12-01 DIAGNOSIS — M488X2 Other specified spondylopathies, cervical region: Secondary | ICD-10-CM | POA: Diagnosis present

## 2022-12-01 DIAGNOSIS — Z888 Allergy status to other drugs, medicaments and biological substances status: Secondary | ICD-10-CM

## 2022-12-01 DIAGNOSIS — Z6841 Body Mass Index (BMI) 40.0 and over, adult: Secondary | ICD-10-CM

## 2022-12-01 DIAGNOSIS — Z79899 Other long term (current) drug therapy: Secondary | ICD-10-CM

## 2022-12-01 DIAGNOSIS — K219 Gastro-esophageal reflux disease without esophagitis: Secondary | ICD-10-CM | POA: Diagnosis present

## 2022-12-01 DIAGNOSIS — G8929 Other chronic pain: Secondary | ICD-10-CM | POA: Diagnosis present

## 2022-12-01 DIAGNOSIS — Z7982 Long term (current) use of aspirin: Secondary | ICD-10-CM

## 2022-12-01 DIAGNOSIS — E785 Hyperlipidemia, unspecified: Secondary | ICD-10-CM | POA: Diagnosis present

## 2022-12-01 DIAGNOSIS — J9600 Acute respiratory failure, unspecified whether with hypoxia or hypercapnia: Principal | ICD-10-CM | POA: Diagnosis present

## 2022-12-01 DIAGNOSIS — F411 Generalized anxiety disorder: Secondary | ICD-10-CM | POA: Diagnosis present

## 2022-12-01 DIAGNOSIS — I10 Essential (primary) hypertension: Secondary | ICD-10-CM | POA: Diagnosis present

## 2022-12-01 DIAGNOSIS — Z90710 Acquired absence of both cervix and uterus: Secondary | ICD-10-CM

## 2022-12-01 DIAGNOSIS — J9601 Acute respiratory failure with hypoxia: Secondary | ICD-10-CM | POA: Diagnosis present

## 2022-12-01 DIAGNOSIS — U071 COVID-19: Secondary | ICD-10-CM | POA: Diagnosis not present

## 2022-12-01 DIAGNOSIS — Z955 Presence of coronary angioplasty implant and graft: Secondary | ICD-10-CM

## 2022-12-01 DIAGNOSIS — I2510 Atherosclerotic heart disease of native coronary artery without angina pectoris: Secondary | ICD-10-CM | POA: Diagnosis present

## 2022-12-01 DIAGNOSIS — D72829 Elevated white blood cell count, unspecified: Secondary | ICD-10-CM | POA: Diagnosis present

## 2022-12-01 DIAGNOSIS — E278 Other specified disorders of adrenal gland: Secondary | ICD-10-CM | POA: Diagnosis present

## 2022-12-01 DIAGNOSIS — Z880 Allergy status to penicillin: Secondary | ICD-10-CM

## 2022-12-01 DIAGNOSIS — I252 Old myocardial infarction: Secondary | ICD-10-CM

## 2022-12-01 NOTE — ED Triage Notes (Signed)
Pt reports fever, cough, HA and body aches since last night; DENIES congestion, ShoB, n/v/d

## 2022-12-02 ENCOUNTER — Encounter (HOSPITAL_BASED_OUTPATIENT_CLINIC_OR_DEPARTMENT_OTHER): Payer: Self-pay | Admitting: Family Medicine

## 2022-12-02 ENCOUNTER — Emergency Department (HOSPITAL_BASED_OUTPATIENT_CLINIC_OR_DEPARTMENT_OTHER): Payer: Managed Care, Other (non HMO)

## 2022-12-02 ENCOUNTER — Other Ambulatory Visit: Payer: Self-pay

## 2022-12-02 DIAGNOSIS — J9601 Acute respiratory failure with hypoxia: Secondary | ICD-10-CM | POA: Diagnosis present

## 2022-12-02 DIAGNOSIS — U071 COVID-19: Secondary | ICD-10-CM | POA: Diagnosis present

## 2022-12-02 DIAGNOSIS — D72829 Elevated white blood cell count, unspecified: Secondary | ICD-10-CM | POA: Diagnosis present

## 2022-12-02 DIAGNOSIS — I10 Essential (primary) hypertension: Secondary | ICD-10-CM | POA: Diagnosis present

## 2022-12-02 DIAGNOSIS — Z7982 Long term (current) use of aspirin: Secondary | ICD-10-CM | POA: Diagnosis not present

## 2022-12-02 DIAGNOSIS — Z955 Presence of coronary angioplasty implant and graft: Secondary | ICD-10-CM | POA: Diagnosis not present

## 2022-12-02 DIAGNOSIS — E785 Hyperlipidemia, unspecified: Secondary | ICD-10-CM

## 2022-12-02 DIAGNOSIS — Z888 Allergy status to other drugs, medicaments and biological substances status: Secondary | ICD-10-CM | POA: Diagnosis not present

## 2022-12-02 DIAGNOSIS — F411 Generalized anxiety disorder: Secondary | ICD-10-CM | POA: Diagnosis present

## 2022-12-02 DIAGNOSIS — G8929 Other chronic pain: Secondary | ICD-10-CM | POA: Diagnosis present

## 2022-12-02 DIAGNOSIS — Z79899 Other long term (current) drug therapy: Secondary | ICD-10-CM | POA: Diagnosis not present

## 2022-12-02 DIAGNOSIS — Z880 Allergy status to penicillin: Secondary | ICD-10-CM | POA: Diagnosis not present

## 2022-12-02 DIAGNOSIS — J9600 Acute respiratory failure, unspecified whether with hypoxia or hypercapnia: Secondary | ICD-10-CM

## 2022-12-02 DIAGNOSIS — K219 Gastro-esophageal reflux disease without esophagitis: Secondary | ICD-10-CM | POA: Diagnosis present

## 2022-12-02 DIAGNOSIS — M488X2 Other specified spondylopathies, cervical region: Secondary | ICD-10-CM | POA: Diagnosis present

## 2022-12-02 DIAGNOSIS — Z90710 Acquired absence of both cervix and uterus: Secondary | ICD-10-CM | POA: Diagnosis not present

## 2022-12-02 DIAGNOSIS — I2510 Atherosclerotic heart disease of native coronary artery without angina pectoris: Secondary | ICD-10-CM | POA: Diagnosis present

## 2022-12-02 DIAGNOSIS — E278 Other specified disorders of adrenal gland: Secondary | ICD-10-CM | POA: Diagnosis present

## 2022-12-02 DIAGNOSIS — Z6841 Body Mass Index (BMI) 40.0 and over, adult: Secondary | ICD-10-CM

## 2022-12-02 DIAGNOSIS — I252 Old myocardial infarction: Secondary | ICD-10-CM | POA: Diagnosis not present

## 2022-12-02 LAB — CBC WITH DIFFERENTIAL/PLATELET
Abs Immature Granulocytes: 0.07 10*3/uL (ref 0.00–0.07)
Basophils Absolute: 0.1 10*3/uL (ref 0.0–0.1)
Basophils Relative: 1 %
Eosinophils Absolute: 0 10*3/uL (ref 0.0–0.5)
Eosinophils Relative: 0 %
HCT: 42.3 % (ref 36.0–46.0)
Hemoglobin: 14 g/dL (ref 12.0–15.0)
Immature Granulocytes: 1 %
Lymphocytes Relative: 9 %
Lymphs Abs: 1 10*3/uL (ref 0.7–4.0)
MCH: 28 pg (ref 26.0–34.0)
MCHC: 33.1 g/dL (ref 30.0–36.0)
MCV: 84.6 fL (ref 80.0–100.0)
Monocytes Absolute: 0.5 10*3/uL (ref 0.1–1.0)
Monocytes Relative: 4 %
Neutro Abs: 9.5 10*3/uL — ABNORMAL HIGH (ref 1.7–7.7)
Neutrophils Relative %: 85 %
Platelets: 216 10*3/uL (ref 150–400)
RBC: 5 MIL/uL (ref 3.87–5.11)
RDW: 13.2 % (ref 11.5–15.5)
WBC: 11.1 10*3/uL — ABNORMAL HIGH (ref 4.0–10.5)
nRBC: 0 % (ref 0.0–0.2)

## 2022-12-02 LAB — COMPREHENSIVE METABOLIC PANEL
ALT: 18 U/L (ref 0–44)
AST: 17 U/L (ref 15–41)
Albumin: 3.6 g/dL (ref 3.5–5.0)
Alkaline Phosphatase: 58 U/L (ref 38–126)
Anion gap: 9 (ref 5–15)
BUN: 10 mg/dL (ref 8–23)
CO2: 22 mmol/L (ref 22–32)
Calcium: 8.9 mg/dL (ref 8.9–10.3)
Chloride: 107 mmol/L (ref 98–111)
Creatinine, Ser: 1.01 mg/dL — ABNORMAL HIGH (ref 0.44–1.00)
GFR, Estimated: >60 mL/min (ref >60)
Glucose, Bld: 134 mg/dL — ABNORMAL HIGH (ref 70–99)
Potassium: 3.9 mmol/L (ref 3.5–5.1)
Sodium: 138 mmol/L (ref 135–145)
Total Bilirubin: 0.6 mg/dL (ref 0.3–1.2)
Total Protein: 7.4 g/dL (ref 6.5–8.1)

## 2022-12-02 LAB — TROPONIN I (HIGH SENSITIVITY)
Troponin I (High Sensitivity): 4 ng/L (ref <18)
Troponin I (High Sensitivity): 4 ng/L (ref <18)

## 2022-12-02 LAB — LACTATE DEHYDROGENASE: LDH: 149 U/L (ref 98–192)

## 2022-12-02 LAB — PROTIME-INR
INR: 1 (ref 0.8–1.2)
Prothrombin Time: 13.8 seconds (ref 11.4–15.2)

## 2022-12-02 LAB — D-DIMER, QUANTITATIVE: D-Dimer, Quant: 2.06 ug/mL-FEU — ABNORMAL HIGH (ref 0.00–0.50)

## 2022-12-02 LAB — C-REACTIVE PROTEIN: CRP: 3.8 mg/dL — ABNORMAL HIGH (ref <1.0)

## 2022-12-02 LAB — PROCALCITONIN: Procalcitonin: 0.1 ng/mL

## 2022-12-02 LAB — HIV ANTIBODY (ROUTINE TESTING W REFLEX): HIV Screen 4th Generation wRfx: NONREACTIVE

## 2022-12-02 MED ORDER — KETOROLAC TROMETHAMINE 30 MG/ML IJ SOLN
30.0000 mg | Freq: Once | INTRAMUSCULAR | Status: AC
  Administered 2022-12-02: 30 mg via INTRAVENOUS
  Filled 2022-12-02: qty 1

## 2022-12-02 MED ORDER — POTASSIUM CHLORIDE IN NACL 20-0.9 MEQ/L-% IV SOLN
INTRAVENOUS | Status: DC
  Filled 2022-12-02: qty 1000

## 2022-12-02 MED ORDER — CARVEDILOL 6.25 MG PO TABS
6.2500 mg | ORAL_TABLET | Freq: Two times a day (BID) | ORAL | Status: DC
  Administered 2022-12-02 – 2022-12-04 (×4): 6.25 mg via ORAL
  Filled 2022-12-02 (×4): qty 1

## 2022-12-02 MED ORDER — FOLIC ACID 1 MG PO TABS
1.0000 mg | ORAL_TABLET | Freq: Every day | ORAL | Status: DC
  Administered 2022-12-02 – 2022-12-04 (×2): 1 mg via ORAL
  Filled 2022-12-02 (×3): qty 1

## 2022-12-02 MED ORDER — DEXAMETHASONE 4 MG PO TABS
6.0000 mg | ORAL_TABLET | ORAL | Status: DC
  Administered 2022-12-03 – 2022-12-04 (×2): 6 mg via ORAL
  Filled 2022-12-02 (×2): qty 2

## 2022-12-02 MED ORDER — IOHEXOL 350 MG/ML SOLN
100.0000 mL | Freq: Once | INTRAVENOUS | Status: AC | PRN
  Administered 2022-12-02: 100 mL via INTRAVENOUS

## 2022-12-02 MED ORDER — SERTRALINE HCL 25 MG PO TABS
50.0000 mg | ORAL_TABLET | Freq: Every day | ORAL | Status: DC
  Administered 2022-12-02 – 2022-12-04 (×3): 50 mg via ORAL
  Filled 2022-12-02 (×3): qty 2

## 2022-12-02 MED ORDER — LOSARTAN POTASSIUM 50 MG PO TABS
50.0000 mg | ORAL_TABLET | Freq: Every day | ORAL | Status: DC
  Administered 2022-12-02 – 2022-12-03 (×2): 50 mg via ORAL
  Filled 2022-12-02 (×2): qty 1

## 2022-12-02 MED ORDER — THIAMINE MONONITRATE 100 MG PO TABS
100.0000 mg | ORAL_TABLET | Freq: Every day | ORAL | Status: DC
  Administered 2022-12-02 – 2022-12-04 (×2): 100 mg via ORAL
  Filled 2022-12-02 (×3): qty 1

## 2022-12-02 MED ORDER — ACETAMINOPHEN 325 MG PO TABS
650.0000 mg | ORAL_TABLET | Freq: Once | ORAL | Status: AC
  Administered 2022-12-02: 650 mg via ORAL
  Filled 2022-12-02: qty 2

## 2022-12-02 MED ORDER — OXYCODONE HCL 5 MG PO TABS: 5.0000 mg | ORAL_TABLET | ORAL | Status: DC | PRN

## 2022-12-02 MED ORDER — NIRMATRELVIR/RITONAVIR (PAXLOVID)TABLET
3.0000 | ORAL_TABLET | Freq: Two times a day (BID) | ORAL | Status: DC
  Administered 2022-12-02 – 2022-12-04 (×4): 3 via ORAL
  Filled 2022-12-02: qty 30

## 2022-12-02 MED ORDER — ONDANSETRON HCL 4 MG/2ML IJ SOLN: 4.0000 mg | Freq: Four times a day (QID) | INTRAMUSCULAR | Status: DC | PRN

## 2022-12-02 MED ORDER — ASPIRIN 81 MG PO TBEC
81.0000 mg | DELAYED_RELEASE_TABLET | Freq: Every day | ORAL | Status: DC
  Administered 2022-12-02 – 2022-12-04 (×3): 81 mg via ORAL
  Filled 2022-12-02 (×3): qty 1

## 2022-12-02 MED ORDER — SODIUM CHLORIDE 0.9% FLUSH
3.0000 mL | Freq: Two times a day (BID) | INTRAVENOUS | Status: DC
  Administered 2022-12-02 – 2022-12-04 (×3): 3 mL via INTRAVENOUS

## 2022-12-02 MED ORDER — DEXAMETHASONE SODIUM PHOSPHATE 10 MG/ML IJ SOLN
10.0000 mg | Freq: Once | INTRAMUSCULAR | Status: AC
  Administered 2022-12-02: 10 mg via INTRAVENOUS
  Filled 2022-12-02: qty 1

## 2022-12-02 MED ORDER — ADULT MULTIVITAMIN W/MINERALS CH
1.0000 | ORAL_TABLET | Freq: Every day | ORAL | Status: DC
  Administered 2022-12-02 – 2022-12-04 (×2): 1 via ORAL
  Filled 2022-12-02 (×3): qty 1

## 2022-12-02 MED ORDER — PANTOPRAZOLE SODIUM 40 MG PO TBEC
40.0000 mg | DELAYED_RELEASE_TABLET | Freq: Every day | ORAL | Status: DC
  Administered 2022-12-02 – 2022-12-04 (×3): 40 mg via ORAL
  Filled 2022-12-02 (×3): qty 1

## 2022-12-02 MED ORDER — POLYETHYLENE GLYCOL 3350 17 G PO PACK: 17.0000 g | PACK | Freq: Every day | ORAL | Status: DC | PRN

## 2022-12-02 MED ORDER — LINACLOTIDE 145 MCG PO CAPS: 290.0000 ug | ORAL_CAPSULE | ORAL | Status: DC | PRN

## 2022-12-02 MED ORDER — ENOXAPARIN SODIUM 60 MG/0.6ML IJ SOSY
55.0000 mg | PREFILLED_SYRINGE | INTRAMUSCULAR | Status: DC
  Administered 2022-12-03: 55 mg via SUBCUTANEOUS
  Filled 2022-12-02: qty 0.6

## 2022-12-02 MED ORDER — ALBUTEROL SULFATE HFA 108 (90 BASE) MCG/ACT IN AERS: 2.0000 | INHALATION_SPRAY | RESPIRATORY_TRACT | Status: DC | PRN

## 2022-12-02 MED ORDER — SODIUM CHLORIDE 0.9 % IV BOLUS
1000.0000 mL | Freq: Once | INTRAVENOUS | Status: AC
  Administered 2022-12-02: 1000 mL via INTRAVENOUS

## 2022-12-02 MED ORDER — ACETAMINOPHEN 325 MG PO TABS
650.0000 mg | ORAL_TABLET | Freq: Four times a day (QID) | ORAL | Status: DC | PRN
  Administered 2022-12-03: 650 mg via ORAL
  Filled 2022-12-02: qty 2

## 2022-12-02 MED ORDER — ONDANSETRON HCL 4 MG/2ML IJ SOLN
4.0000 mg | Freq: Once | INTRAMUSCULAR | Status: AC
  Administered 2022-12-02: 4 mg via INTRAVENOUS
  Filled 2022-12-02: qty 2

## 2022-12-02 NOTE — H&P (Signed)
History and Physical   Lynn Hodges LKG:[id number] DOB: 05/11/1960 DOA: 12/01/2022  PCP: Randie Heinz, PA   Patient coming from: Home  Chief Complaint: Fever  HPI: Lynn Hodges is a 62 y.o. female with medical history significant of CAD status post stent, hypertension, hyperlipidemia, GERD, anxiety, cervical spine disease, chronic pain, obesity, gout presenting with fever.  Patient reports ongoing fevers for the past couple days with associated body aches, congestion, cough productive of clear sputum, headaches, chills.  Patient had tried Tylenol at home without relief.  Fevers as high as 103 at home.  No recent travel or sick contacts known.  Denies shortness of breath.  Further denies chest pain, abdominal pain, constipation, diarrhea, nausea, vomiting.  ED Course: Vital signs in the ED notable for fever to one 2.6, blood pressure in the 130s to 160s systolic, respiratory rate in the 20s, saturating in the upper 80s on room air requiring 2 L to maintain saturations.  Lab workup included CMP with glucose 127 and 134 and repeat in the morning, creatinine stable and remained stable in the morning.  CBC with mild leukocytosis to 10.9 which remained stable at 11.1 and repeat in the morning.  PT and INR normal.  Lactic acid normal.  Troponin negative x 2.  CRP 3.8.  LDH normal.  D-dimer 2.06.  COVID screen positive.  Chest x-ray with mild bilateral airspace disease at bases with small left pleural effusion suspected.  CT PE study was negative for PE and showed platelike atelectasis versus scarring with no obvious consolidations.  Adrenal nodule also noted.  Patient received Paxlovid, Decadron, Zofran, Coreg, vitamins, liter fluids, albuterol, oxycodone prior to transfer.  Review of Systems: As per HPI otherwise all other systems reviewed and are negative.  Past Medical History:  Diagnosis Date   Arthritis    Family history of adverse reaction to anesthesia    malignant hyperthermia with  2 cousins and mother's sister   Hypertension    Old MI (myocardial infarction) 11/30/2019    Past Surgical History:  Procedure Laterality Date   ABDOMINAL HYSTERECTOMY     ABDOMINAL SURGERY     CARPAL TUNNEL RELEASE Right 10/14/2016   Procedure: CARPAL TUNNEL RELEASE;  Surgeon: Coletta Memos, MD;  Location: [facility] OR;  Service: Neurosurgery;  Laterality: Right;  CARPAL TUNNEL RELEASE   CATARACT EXTRACTION Right    CESAREAN SECTION     x3   KNEE ARTHROSCOPY Left     Social History  reports that she has never smoked. She has never used smokeless tobacco. She reports that she does not currently use alcohol. She reports that she does not use drugs.  Allergies  Allergen Reactions   Anesthetics, Halogenated Other (See Comments)    Family history (siblings) of MALIGNANT HYPERTHERMIA, patient has had IV Anesthesia in the past, but no gas.   Ezetimibe Itching   Statins Itching, Rash and Shortness Of Breath   Succinylcholine Other (See Comments)    Family history (siblings) of MALIGNANT HYPERTHERMIA. Patient has had IV sedation but no gas.   Penicillins Nausea And Vomiting, Nausea Only, Rash and Other (See Comments)    GI   Allopurinol Nausea Only and Other (See Comments)    GI upset   Lisinopril Cough    History reviewed. No pertinent family history.   Prior to Admission medications   Medication Sig Start Date End Date Taking? Authorizing Provider  aspirin EC 81 MG tablet Take 81 mg by mouth daily.   Yes [provider]  carvedilol (COREG) 6.25 MG tablet Take 6.25 mg by mouth 2 (two) times daily. 11/09/22  Yes [provider]  cholecalciferol (VITAMIN D) 1000 units tablet Take 2,000 Units by mouth daily.   Yes [provider]  estradiol (ESTRACE) 0.5 MG tablet Take 0.5 mg by mouth daily.   Yes [provider]  LINZESS 290 MCG CAPS capsule Take 290 mcg by mouth as needed (constipation). 10/12/22  Yes [provider]  losartan (COZAAR) 50 MG  tablet Take 50 mg by mouth daily.   Yes [provider]  nitroGLYCERIN (NITROSTAT) 0.4 MG SL tablet Place 0.4 mg under the tongue every 5 (five) minutes as needed for chest pain. 11/26/22  Yes [provider]  pantoprazole (PROTONIX) 40 MG tablet Take 40 mg by mouth daily. 11/09/22  Yes [provider]  REPATHA SURECLICK 140 MG/ML SOAJ Inject 140 mg into the skin every 14 (fourteen) days. 11/11/22  Yes [provider]  sertraline (ZOLOFT) 50 MG tablet Take 50 mg by mouth daily. 11/09/22  Yes [provider]  acetaminophen-codeine (TYLENOL #3) 300-30 MG tablet Take 1 tablet by mouth every 6 (six) hours as needed for moderate pain. Patient not taking: Reported on 12/02/2022 10/14/16   Coletta Memos, MD    Physical Exam: Vitals:   12/02/22 0826 12/02/22 0900 12/02/22 1130 12/02/22 1200  BP:  (!) 155/97 (!) 160/92 (!) 146/84  Pulse: 85 86 91 81  Resp: (!) 24 20 (!) 25 (!) 21  Temp: 99.3 F (37.4 C)   99.5 F (37.5 C)  TempSrc: Oral   Oral  SpO2: 94% 99% 93% 94%  Weight:      Height:        Physical Exam Constitutional:      General: She is not in acute distress.    Appearance: Normal appearance. She is obese.  HENT:     Head: Normocephalic and atraumatic.     Mouth/Throat:     Mouth: Mucous membranes are moist.     Pharynx: Oropharynx is clear.  Eyes:     Extraocular Movements: Extraocular movements intact.     Pupils: Pupils are equal, round, and reactive to light.  Cardiovascular:     Rate and Rhythm: Normal rate and regular rhythm.     Pulses: Normal pulses.     Heart sounds: Normal heart sounds.  Pulmonary:     Effort: Pulmonary effort is normal. No respiratory distress.     Breath sounds: Normal breath sounds.  Abdominal:     General: Bowel sounds are normal. There is no distension.     Palpations: Abdomen is soft.     Tenderness: There is no abdominal tenderness.  Musculoskeletal:        General: No swelling or deformity.   Skin:    General: Skin is warm and dry.  Neurological:     General: No focal deficit present.     Mental Status: Mental status is at baseline.    Labs on Admission: I have personally reviewed following labs and imaging studies  CBC: Recent Labs  Lab 12/01/22 2341 12/02/22 0544  WBC 10.9* 11.1*  NEUTROABS 8.1* 9.5*  HGB 14.5 14.0  HCT 43.8 42.3  MCV 84.2 84.6  PLT 232 216    Basic Metabolic Panel: Recent Labs  Lab 12/01/22 2341 12/02/22 0544  NA 136 138  K 3.8 3.9  CL 103 107  CO2 23 22  GLUCOSE 127* 134*  BUN 11 10  CREATININE  1.04* 1.01*  CALCIUM 9.2 8.9    GFR: Estimated Creatinine Clearance: 75 mL/min (A) (by C-G formula based on SCr of 1.01 mg/dL (H)).  Liver Function Tests: Recent Labs  Lab 12/01/22 2341 12/02/22 0544  AST 22 17  ALT 18 18  ALKPHOS 64 58  BILITOT 0.9 0.6  PROT 7.6 7.4  ALBUMIN 3.8 3.6    Urine analysis: No results found for: "COLORURINE", "APPEARANCEUR", "LABSPEC", "PHURINE", "GLUCOSEU", "HGBUR", "BILIRUBINUR", "KETONESUR", "PROTEINUR", "UROBILINOGEN", "NITRITE", "LEUKOCYTESUR"  Radiological Exams on Admission: CT Angio Chest PE W and/or Wo Contrast  Result Date: 12/02/2022 CLINICAL DATA:  Pulmonary embolism (PE) suspected, high prob EXAM: CT ANGIOGRAPHY CHEST WITH CONTRAST TECHNIQUE: Multidetector CT imaging of the chest was performed using the standard protocol during bolus administration of intravenous contrast. Multiplanar CT image reconstructions and MIPs were obtained to evaluate the vascular anatomy. RADIATION DOSE REDUCTION: This exam was performed according to the departmental dose-optimization program which includes automated exposure control, adjustment of the mA and/or kV according to patient size and/or use of iterative reconstruction technique. CONTRAST:  OMNIPAQUE IOHEXOL 350 MG/ML SOLN COMPARISON:  None Available. FINDINGS: Cardiovascular: No evidence of embolism to the proximal subsegmental pulmonary artery  level. Normal cardiac size. No pericardial effusion. No aortic aneurysm. There are coronary artery calcifications, in keeping with coronary artery disease. There is a stent in the proximal LAD. Mediastinum/Nodes: Visualized thyroid gland appears grossly unremarkable. No solid / cystic mediastinal masses. The esophagus is nondistended precluding optimal assessment. No axillary, mediastinal or hilar lymphadenopathy by size criteria. Lungs/Pleura: The central tracheo-bronchial tree is patent. There are patchy areas of linear, plate-like atelectasis and/or scarring throughout bilateral lungs. No mass or consolidation. No pleural effusion or pneumothorax. No suspicious lung nodules. Upper Abdomen: There is a 1.1 x 1.5 cm left adrenal adenoma with internal CT attenuation of 30-35 Hounsfield units, indeterminate on this exam. Remaining visualized upper abdominal viscera within normal limits. Musculoskeletal: The visualized soft tissues of the chest wall are grossly unremarkable. No suspicious osseous lesions. There are mild multilevel degenerative changes in the visualized spine. Review of the MIP images confirms the above findings. IMPRESSION: 1. No pulmonary embolism to the proximal subsegmental pulmonary artery level. 2. There are patchy areas of linear, plate-like atelectasis and/or scarring throughout bilateral lungs. 3. Indeterminate 1.5 cm left adrenal nodule. If prior imaging is available, comparison can be made to document stability. Otherwise, recommend follow-up MRI abdomen versus adrenal protocol CT in 1 year. If stable for ? 1 year, no further follow-up imaging. JACR 2017 Aug; 14(8):1038-44, JCAT 2016 Mar-Apr; 40(2):194-200, Urol J 2006 Spring; 3(2):71-4. 4. Multiple other nonacute observations, as described above. Electronically Signed   By: Jules Schick M.D.   On: 12/02/2022 10:43   DG Chest 2 View  Result Date: 12/02/2022 CLINICAL DATA:  Cough, fever, headache, and body aches. EXAM: CHEST - 2 VIEW  COMPARISON:  10/29/2022. FINDINGS: The heart size and mediastinal contours are stable. Mild airspace disease is noted at the lung bases, greater on the left than on the right. There is blunting of the left costophrenic angle in the possibility of a small pleural effusion can not be excluded. No pneumothorax. Degenerative changes are noted in the thoracic spine. IMPRESSION: 1. Mild airspace disease at the lung bases bilaterally, possible atelectasis or infiltrate. 2. Blunting of the left costophrenic angle, suggesting small effusion. Electronically Signed   By: Thornell Sartorius M.D.   On: 12/02/2022 00:22    EKG: Independently reviewed.  Sinus rhythm at 91 bpm.  Nonspecific T wave flattening.  Minimal baseline wander.  Assessment/Plan Principal Problem:   Acute respiratory failure due to COVID-19 [facility]) Active Problems:   Atherosclerotic heart disease of native coronary artery without angina pectoris   Class 3 severe obesity due to excess calories with serious comorbidity and body mass index (BMI) of 40.0 to 44.9 in adult [facility])   Dyslipidemia   Essential hypertension   Gastroesophageal reflux disease without esophagitis   Generalized anxiety disorder   Presence of coronary angioplasty implant and graft   Acute respiratory failure secondary to COVID-19 > Presenting with fevers, chills, body aches, congestion, cough. > Positive for COVID-19 in the ED.  Does have leukocytosis but no obvious pneumonia on chest x-ray.  Will check urine and procalcitonin to evaluate for any concurrent bacterial infection. > Saturating in the 80s on room air and improved on 2 L supplemental oxygen. > Started on Paxlovid and Decadron overnight. - Monitor on telemetry, continuous pulse ox - Continue supplemental oxygen, wean as tolerated - Continue with Paxlovid and Decadron - Pulmonary toilet - As needed albuterol - Vitamin supplementation - Supportive care  CAD Hyperlipidemia > History of stent placement -  Continue home aspirin, carvedilol, losartan - On Repatha outpatient  GERD - Continue home PPI  Anxiety - Continue home sertraline  History of cervical spinal disease Chronic pain - Continue oxycodone PRN  Obesity - Noted  DVT prophylaxis: Lovenox Code Status:   Full Family Communication:  None on admission  Disposition Plan:   Patient is from:  Home  Anticipated DC to:  Home  Anticipated DC date:  2 to 3 days  Anticipated DC barriers: None  Consults called:  None Admission status:  Inpatient, telemetry  Severity of Illness: The appropriate patient status for this patient is INPATIENT. Inpatient status is judged to be reasonable and necessary in order to provide the required intensity of service to ensure the patient's safety. The patient's presenting symptoms, physical exam findings, and initial radiographic and laboratory data in the context of their chronic comorbidities is felt to place them at high risk for further clinical deterioration. Furthermore, it is not anticipated that the patient will be medically stable for discharge from the hospital within 2 midnights of admission.   * I certify that at the point of admission it is my clinical judgment that the patient will require inpatient hospital care spanning beyond 2 midnights from the point of admission due to high intensity of service, high risk for further deterioration and high frequency of surveillance required.Synetta Fail MD [facility]  How to contact the [facility] Attending or Consulting provider 7A - 7P or covering provider during after hours 7P -7A, for this patient?   Check the care team in [facility] and look for a) attending/consulting [facility] provider listed and b) the [facility] team listed Log into [email or web address] and use [place]'s universal password to access. If you do not have the password, please contact the hospital operator. Locate the [facility] provider you are looking for under [facility] and page  to a number that you can be directly reached. If you still have difficulty reaching the provider, please page the [facility] (Director on Call) for the Hospitalists listed on amion for assistance.  12/02/2022, 3:47 PM

## 2022-12-02 NOTE — ED Notes (Signed)
[facility] called and said the bed was not ready on their end but its ready on my end talked to Lauren at 1:06

## 2022-12-02 NOTE — ED Provider Notes (Signed)
[place] [facility] Provider Note   CSN: [id number] Arrival date & time: 12/01/22  2316     History  Chief Complaint  Patient presents with   Fever    Lynn Hodges is a 62 y.o. female.  Patient with a history of hypertension, CAD with stent presenting with a 2-day history of bodyaches, cough, congestion, fever, headache, chills.  States coughing up clear mucus.  Feels sore and achy all over and has a diffuse headache.  Taking Tylenol at home without relief and still having fevers up to 103.  No travel or sick contacts.  Does have chest pain only with coughing that is dissimilar to her previous cardiac pain.  Denies feeling short of breath.  Denies any nausea, vomiting, diarrhea, pain with urination or blood in the urine. Feels sore and achy all over and has a headache.  Headache is gradual in onset and diffuse.  No neck pain.  No focal weakness, numbness or tingling.  Having cough and congestion and bodyaches and chills.  Productive cough of clear mucus.  The history is provided by the spouse and the patient.  Fever Associated symptoms: chest pain, chills, congestion, cough, headaches, myalgias, rhinorrhea and sore throat   Associated symptoms: no dysuria, no nausea and no vomiting        Home Medications Prior to Admission medications   Medication Sig Start Date End Date Taking? Authorizing Provider  acetaminophen-codeine (TYLENOL #3) 300-30 MG tablet Take 1 tablet by mouth every 6 (six) hours as needed for moderate pain. 10/14/16   Coletta Memos, MD  aspirin EC 81 MG tablet Take 81 mg by mouth daily.    [provider]  cholecalciferol (VITAMIN D) 1000 units tablet Take 2,000 Units by mouth daily.    [provider]  estradiol (ESTRACE) 0.5 MG tablet Take 0.5 mg by mouth daily.    [provider]  losartan (COZAAR) 50 MG tablet Take 50 mg by mouth daily.    [provider]  Magnesium 250 MG TABS Take 1  tablet by mouth daily.    [provider]  omeprazole (PRILOSEC) 20 MG capsule Take 20 mg by mouth daily.    [provider]  vitamin B-12 (CYANOCOBALAMIN) 1000 MCG tablet Take 1,000 mcg by mouth daily.    [provider]  vitamin E (VITAMIN E) 400 UNIT capsule Take 400 Units by mouth daily.    [provider]      Allergies    Statins and Penicillins    Review of Systems   Review of Systems  Constitutional:  Positive for activity change, appetite change, chills, fatigue and fever.  HENT:  Positive for congestion, rhinorrhea and sore throat.   Respiratory:  Positive for cough. Negative for shortness of breath.   Cardiovascular:  Positive for chest pain.  Gastrointestinal:  Negative for abdominal pain, nausea and vomiting.  Genitourinary:  Negative for dysuria and hematuria.  Musculoskeletal:  Positive for arthralgias and myalgias.  Skin:  Negative for wound.  Neurological:  Positive for weakness and headaches.    all other systems are negative except as noted in the HPI and PMH.   Physical Exam Updated Vital Signs BP (!) 142/89 (BP Location: Left Arm)   Pulse 100   Temp (!) 102.6 F (39.2 C)   Resp 20   Ht 5\' 7"  (1.702 m)   Wt 113.4 kg   SpO2 95%   BMI 39.16 kg/m  Physical Exam Vitals  and nursing note reviewed.  Constitutional:      General: She is not in acute distress.    Appearance: Normal appearance. She is well-developed and normal weight. She is not ill-appearing.  HENT:     Head: Normocephalic and atraumatic.     Mouth/Throat:     Pharynx: No oropharyngeal exudate.  Eyes:     Conjunctiva/sclera: Conjunctivae normal.     Pupils: Pupils are equal, round, and reactive to light.  Neck:     Comments: No meningismus. Cardiovascular:     Rate and Rhythm: Normal rate and regular rhythm.     Heart sounds: Normal heart sounds. No murmur heard. Pulmonary:     Effort: Pulmonary effort is normal. No respiratory distress.     Breath  sounds: Normal breath sounds.  Abdominal:     Palpations: Abdomen is soft.     Tenderness: There is no abdominal tenderness. There is no guarding or rebound.  Musculoskeletal:        General: No tenderness. Normal range of motion.     Cervical back: Normal range of motion and neck supple.  Skin:    General: Skin is warm.  Neurological:     Mental Status: She is alert and oriented to person, place, and time.     Cranial Nerves: No cranial nerve deficit.     Motor: No abnormal muscle tone.     Coordination: Coordination normal.     Comments:  5/5 strength throughout. CN 2-12 intact.Equal grip strength.   Psychiatric:        Behavior: Behavior normal.     ED Results / Procedures / Treatments   Labs (all labs ordered are listed, but only abnormal results are displayed) Labs Reviewed  RESP PANEL BY RT-PCR (RSV, FLU A&B, COVID)  [id number] - Abnormal; Notable for the following components:      Result Value   SARS Coronavirus 2 by RT PCR POSITIVE (*)    All other components within normal limits  COMPREHENSIVE METABOLIC PANEL - Abnormal; Notable for the following components:   Glucose, Bld 127 (*)    Creatinine, Ser 1.04 (*)    All other components within normal limits  CBC WITH DIFFERENTIAL/PLATELET - Abnormal; Notable for the following components:   WBC 10.9 (*)    RBC 5.20 (*)    Neutro Abs 8.1 (*)    Monocytes Absolute 1.2 (*)    All other components within normal limits  LACTIC ACID, PLASMA  D-DIMER, QUANTITATIVE  C-REACTIVE PROTEIN  LACTATE DEHYDROGENASE  PROTIME-INR  COMPREHENSIVE METABOLIC PANEL  CBC WITH DIFFERENTIAL/PLATELET  TROPONIN I (HIGH SENSITIVITY)  TROPONIN I (HIGH SENSITIVITY)    EKG None  Radiology DG Chest 2 View  Result Date: 12/02/2022 CLINICAL DATA:  Cough, fever, headache, and body aches. EXAM: CHEST - 2 VIEW COMPARISON:  10/29/2022. FINDINGS: The heart size and mediastinal contours are stable. Mild airspace disease is noted at the lung bases,  greater on the left than on the right. There is blunting of the left costophrenic angle in the possibility of a small pleural effusion can not be excluded. No pneumothorax. Degenerative changes are noted in the thoracic spine. IMPRESSION: 1. Mild airspace disease at the lung bases bilaterally, possible atelectasis or infiltrate. 2. Blunting of the left costophrenic angle, suggesting small effusion. Electronically Signed   By: Thornell Sartorius M.D.   On: 12/02/2022 00:22    Procedures Procedures    Medications Ordered in ED Medications  sodium chloride 0.9 % bolus 1,000 mL (  has no administration in time range)  acetaminophen (TYLENOL) tablet 650 mg (has no administration in time range)  ondansetron (ZOFRAN) injection 4 mg (has no administration in time range)    ED Course/ Medical Decision Making/ A&P                                 Medical Decision Making Amount and/or Complexity of Data Reviewed Independent Historian: spouse Labs: ordered. Decision-making details documented in ED Course. Radiology: ordered and independent interpretation performed. Decision-making details documented in ED Course. ECG/medicine tests: ordered and independent interpretation performed. Decision-making details documented in ED Course.  Risk OTC drugs. Prescription drug management. Decision regarding hospitalization.   2 days of cough, congestion, body aches, fever, headache.  No chest pain except with coughing.  No shortness of breath.  Febrile on arrival.  Not hypoxic.  No increased work of breathing.  Clear lungs.  No meningismus.  Patient found to be COVID-positive.  She has borderline oxygenation at rest in the low 90s.  Chest x-ray shows bibasilar atelectasis versus infiltrate.  Results reviewed interpreted by me.  EKG without acute ischemia.  Troponin was negative.  Patient with borderline hypoxia in the high 80s at rest.  Tachypneic to about 30.  Placed on nasal cannula oxygen with improvement in  O2 saturations.  She is given IV Decadron.  Respiratory failure secondary to COVID infection.  Troponin remains negative. Will admission given oxygen requirement.  She request admission to [facility].  No beds available.  Admission to [facility], discussed with Dr. Joneen Roach        Final Clinical Impression(s) / ED Diagnoses Final diagnoses:  None    Rx / DC Orders ED Discharge Orders     None         , Jeannett Senior, MD 12/02/22 [phone]

## 2022-12-02 NOTE — Progress Notes (Signed)
Patient ambulated around the room at a slow pace while on pulse ox.  Patient's SPO2 remained between 93 and 94 % while ambulating while RR increased to 36 and HR to 122.  Patient returned to bed and lied down.  SPO2 dropped between 89% and 90%.

## 2022-12-02 NOTE — ED Notes (Signed)
Called [facility] for transport talked to Lauren at 1:02

## 2022-12-02 NOTE — ED Notes (Signed)
Called [facility] for transport talked to [employer]

## 2022-12-02 NOTE — Progress Notes (Signed)
Pt educated on proper use and frequency of the flutter. Pt demonstrated understanding and use. No needs at this time.

## 2022-12-03 DIAGNOSIS — U071 COVID-19: Secondary | ICD-10-CM | POA: Diagnosis not present

## 2022-12-03 DIAGNOSIS — K219 Gastro-esophageal reflux disease without esophagitis: Secondary | ICD-10-CM | POA: Diagnosis not present

## 2022-12-03 DIAGNOSIS — I10 Essential (primary) hypertension: Secondary | ICD-10-CM | POA: Diagnosis not present

## 2022-12-03 MED ORDER — ESTRADIOL 0.5 MG PO TABS
0.5000 mg | ORAL_TABLET | Freq: Every day | ORAL | Status: DC
  Administered 2022-12-03 – 2022-12-04 (×2): 0.5 mg via ORAL
  Filled 2022-12-03 (×2): qty 1

## 2022-12-03 MED ORDER — IPRATROPIUM-ALBUTEROL 20-100 MCG/ACT IN AERS
2.0000 | INHALATION_SPRAY | Freq: Four times a day (QID) | RESPIRATORY_TRACT | Status: DC
  Administered 2022-12-03 – 2022-12-04 (×3): 2 via RESPIRATORY_TRACT
  Filled 2022-12-03: qty 4

## 2022-12-03 NOTE — Plan of Care (Signed)

## 2022-12-03 NOTE — Plan of Care (Signed)
Olena Heckle LPN

## 2022-12-03 NOTE — Progress Notes (Signed)
PROGRESS NOTE    Lynn Hodges  ZOX:[id number] DOB: 1960-08-07 DOA: 12/01/2022 PCP: Randie Heinz, PA   Chief Complaint  Patient presents with   Fever    Brief Narrative: Patient is a 62 year old obese female, history of CAD status post stent placement, hypertension, hyperlipidemia, GERD, anxiety, cervical spine disease, chronic pain, gout presented with fever, myalgias, upper respiratory symptoms of congestion productive cough of clear sputum, headaches and chills with temperature as high as 103 at home.  Patient seen in the ED workup done consistent with COVID-19 infection.  CT angiogram negative for PE.  Patient noted to be in acute hypoxemic respiratory distress requiring O2 and subsequently placed on Paxlovid, Decadron, supportive care.   Assessment & Plan:   Principal Problem:   Acute respiratory failure due to COVID-19 [facility]) Active Problems:   Atherosclerotic heart disease of native coronary artery without angina pectoris   Class 3 severe obesity due to excess calories with serious comorbidity and body mass index (BMI) of 40.0 to 44.9 in adult [facility])   Dyslipidemia   Essential hypertension   Gastroesophageal reflux disease without esophagitis   Generalized anxiety disorder   Presence of coronary angioplasty implant and graft  #1 acute respiratory failure with hypoxia secondary to COVID-19 infection -Patient presented with flulike symptoms of myalgias, fevers with temps as high as 103, chills, congestion, cough productive of clear sputum. -Patient noted to be hypoxic on room air with sats of 80% on presentation with improvement on 2 L nasal cannula. -COVID-19 PCR positive. Influenza A and B PCR negative.  RSV by PCR negative. -Patient improving clinically with improvement with hypoxia. -Procalcitonin negative. -Will follow inflammatory markers. -Continue Paxlovid, Decadron. -Placed on scheduled Combivent. -Continue pulmonary toilet, vitamin supplementation, supportive  care.  2.  CAD/hyperlipidemia -Prior history of stent placement. -Continue home regimen aspirin, carvedilol, losartan. -On Repatha in the outpatient setting.  3.  GERD -PPI.  4.  Anxiety -Continue home regimen sertraline.  5.  History of cervical spinal disease/chronic pain -Oxycodone as needed.  6.  Severe Obesity -BMI 41.06 kg/m -Lifestyle modification -Outpatient follow-up with PCP.   DVT prophylaxis: Lovenox Code Status: Full Family Communication: Updated patient and husband at bedside. Disposition: Home when clinically improved.  Status is: Inpatient Remains inpatient appropriate because: Severity of illness   Consultants:  None  Procedures:  CT angiogram chest 12/02/2022 Chest x-ray 12/01/2022   Antimicrobials:  Anti-infectives (From admission, onward)    Start     Dose/Rate Route Frequency Ordered Stop   12/02/22 0330  nirmatrelvir/ritonavir (PAXLOVID) 3 tablet        3 tablet Oral 2 times daily 12/02/22 0323 12/06/22 2159         Subjective: Laying in bed.  Overall feeling better than she did on admissions.  Myalgias improving.  Still with a nonproductive cough.  Shortness of breath improving.  Denies any chest pain.  Husband at bedside.  Objective: Vitals:   12/03/22 0514 12/03/22 0657 12/03/22 0755 12/03/22 0852  BP: (!) 170/90  (!) 180/86 (!) 167/82  Pulse: 60  (!) 59 67  Resp: 18  18   Temp: 97.8 F (36.6 C)  98.3 F (36.8 C)   TempSrc: Oral  Oral   SpO2: 100%  97%   Weight:  115.4 kg    Height:        Intake/Output Summary (Last 24 hours) at 12/03/2022 1515 Last data filed at 12/02/2022 2100 Gross per 24 hour  Intake 237 ml  Output --  Net 237 ml   Filed Weights   12/01/22 2333 12/03/22 0657  Weight: 113.4 kg 115.4 kg    Examination:  General exam: Appears calm and comfortable  Respiratory system: Some decreased breath sounds in the bases otherwise clear.  No wheezes, no crackles, no rhonchi.  Fair air movement.  Speaking in  full sentences.   Cardiovascular system: Regular rate rhythm no murmurs rubs or gallops.  No JVD.  No pitting lower extremity edema.  Gastrointestinal system: Abdomen is nondistended, soft and nontender. No organomegaly or masses felt. Normal bowel sounds heard. Central nervous system: Alert and oriented. No focal neurological deficits. Extremities: Symmetric 5 x 5 power. Skin: No rashes, lesions or ulcers Psychiatry: Judgement and insight appear normal. Mood & affect appropriate.     Data Reviewed: I have personally reviewed following labs and imaging studies  CBC: Recent Labs  Lab 12/01/22 2341 12/02/22 0544 12/03/22 0214  WBC 10.9* 11.1* 10.3  NEUTROABS 8.1* 9.5*  --   HGB 14.5 14.0 12.4  HCT 43.8 42.3 39.3  MCV 84.2 84.6 89.3  PLT 232 216 186    Basic Metabolic Panel: Recent Labs  Lab 12/01/22 2341 12/02/22 0544 12/03/22 0214  NA 136 138 135  K 3.8 3.9 3.7  CL 103 107 105  CO2 23 22 21*  GLUCOSE 127* 134* 135*  BUN 11 10 17   CREATININE 1.04* 1.01* 1.07*  CALCIUM 9.2 8.9 8.5*    GFR: Estimated Creatinine Clearance: 70.3 mL/min (A) (by C-G formula based on SCr of 1.07 mg/dL (H)).  Liver Function Tests: Recent Labs  Lab 12/01/22 2341 12/02/22 0544 12/03/22 0214  AST 22 17 18   ALT 18 18 18   ALKPHOS 64 58 54  BILITOT 0.9 0.6 0.4  PROT 7.6 7.4 6.2*  ALBUMIN 3.8 3.6 3.2*    CBG: No results for input(s): "GLUCAP" in the last 168 hours.   Recent Results (from the past 240 hour(s))  Resp panel by RT-PCR (RSV, Flu A&B, Covid) Anterior Nasal Swab     Status: Abnormal   Collection Time: 12/01/22 11:50 PM   Specimen: Anterior Nasal Swab  Result Value Ref Range Status   SARS Coronavirus 2 by RT PCR POSITIVE (A) NEGATIVE Final    Comment: (NOTE) SARS-CoV-2 target nucleic acids are DETECTED.  The SARS-CoV-2 RNA is generally detectable in upper respiratory specimens during the acute phase of infection. Positive results are indicative of the presence of the  identified virus, but do not rule out bacterial infection or co-infection with other pathogens not detected by the test. Clinical correlation with patient history and other diagnostic information is necessary to determine patient infection status. The expected result is Negative.  Fact Sheet for Patients: BloggerCourse.com  Fact Sheet for Healthcare Providers: SeriousBroker.it  This test is not yet approved or cleared by the [place] FDA and  has been authorized for detection and/or diagnosis of SARS-CoV-2 by FDA under an Emergency Use Authorization (EUA).  This EUA will remain in effect (meaning this test can be used) for the duration of  the COVID-19 declaration under Section 564(b)(1) of the A ct, 21 U.S.C. section 360bbb-3(b)(1), unless the authorization is terminated or revoked sooner.     Influenza A by PCR NEGATIVE NEGATIVE Final   Influenza B by PCR NEGATIVE NEGATIVE Final    Comment: (NOTE) The Xpert Xpress SARS-CoV-2/FLU/RSV plus assay is intended as an aid in the diagnosis of influenza from Nasopharyngeal swab specimens and should not be used as a sole basis for  treatment. Nasal washings and aspirates are unacceptable for Xpert Xpress SARS-CoV-2/FLU/RSV testing.  Fact Sheet for Patients: BloggerCourse.com  Fact Sheet for Healthcare Providers: SeriousBroker.it  This test is not yet approved or cleared by the [place] FDA and has been authorized for detection and/or diagnosis of SARS-CoV-2 by FDA under an Emergency Use Authorization (EUA). This EUA will remain in effect (meaning this test can be used) for the duration of the COVID-19 declaration under Section 564(b)(1) of the Act, 21 U.S.C. section 360bbb-3(b)(1), unless the authorization is terminated or revoked.     Resp Syncytial Virus by PCR NEGATIVE NEGATIVE Final    Comment: (NOTE) Fact Sheet for  Patients: BloggerCourse.com  Fact Sheet for Healthcare Providers: SeriousBroker.it  This test is not yet approved or cleared by the [place] FDA and has been authorized for detection and/or diagnosis of SARS-CoV-2 by FDA under an Emergency Use Authorization (EUA). This EUA will remain in effect (meaning this test can be used) for the duration of the COVID-19 declaration under Section 564(b)(1) of the Act, 21 U.S.C. section 360bbb-3(b)(1), unless the authorization is terminated or revoked.  Performed at [facility], [address] [place], [place] [address]          Radiology Studies: CT Angio Chest PE W and/or Wo Contrast  Result Date: 12/02/2022 CLINICAL DATA:  Pulmonary embolism (PE) suspected, high prob EXAM: CT ANGIOGRAPHY CHEST WITH CONTRAST TECHNIQUE: Multidetector CT imaging of the chest was performed using the standard protocol during bolus administration of intravenous contrast. Multiplanar CT image reconstructions and MIPs were obtained to evaluate the vascular anatomy. RADIATION DOSE REDUCTION: This exam was performed according to the departmental dose-optimization program which includes automated exposure control, adjustment of the mA and/or kV according to patient size and/or use of iterative reconstruction technique. CONTRAST:  OMNIPAQUE IOHEXOL 350 MG/ML SOLN COMPARISON:  None Available. FINDINGS: Cardiovascular: No evidence of embolism to the proximal subsegmental pulmonary artery level. Normal cardiac size. No pericardial effusion. No aortic aneurysm. There are coronary artery calcifications, in keeping with coronary artery disease. There is a stent in the proximal LAD. Mediastinum/Nodes: Visualized thyroid gland appears grossly unremarkable. No solid / cystic mediastinal masses. The esophagus is nondistended precluding optimal assessment. No axillary, mediastinal or hilar lymphadenopathy by size  criteria. Lungs/Pleura: The central tracheo-bronchial tree is patent. There are patchy areas of linear, plate-like atelectasis and/or scarring throughout bilateral lungs. No mass or consolidation. No pleural effusion or pneumothorax. No suspicious lung nodules. Upper Abdomen: There is a 1.1 x 1.5 cm left adrenal adenoma with internal CT attenuation of 30-35 Hounsfield units, indeterminate on this exam. Remaining visualized upper abdominal viscera within normal limits. Musculoskeletal: The visualized soft tissues of the chest wall are grossly unremarkable. No suspicious osseous lesions. There are mild multilevel degenerative changes in the visualized spine. Review of the MIP images confirms the above findings. IMPRESSION: 1. No pulmonary embolism to the proximal subsegmental pulmonary artery level. 2. There are patchy areas of linear, plate-like atelectasis and/or scarring throughout bilateral lungs. 3. Indeterminate 1.5 cm left adrenal nodule. If prior imaging is available, comparison can be made to document stability. Otherwise, recommend follow-up MRI abdomen versus adrenal protocol CT in 1 year. If stable for ? 1 year, no further follow-up imaging. JACR 2017 Aug; 14(8):1038-44, JCAT 2016 Mar-Apr; 40(2):194-200, Urol J 2006 Spring; 3(2):71-4. 4. Multiple other nonacute observations, as described above. Electronically Signed   By: Jules Schick M.D.   On: 12/02/2022 10:43   DG Chest 2  View  Result Date: 12/02/2022 CLINICAL DATA:  Cough, fever, headache, and body aches. EXAM: CHEST - 2 VIEW COMPARISON:  10/29/2022. FINDINGS: The heart size and mediastinal contours are stable. Mild airspace disease is noted at the lung bases, greater on the left than on the right. There is blunting of the left costophrenic angle in the possibility of a small pleural effusion can not be excluded. No pneumothorax. Degenerative changes are noted in the thoracic spine. IMPRESSION: 1. Mild airspace disease at the lung bases  bilaterally, possible atelectasis or infiltrate. 2. Blunting of the left costophrenic angle, suggesting small effusion. Electronically Signed   By: Thornell Sartorius M.D.   On: 12/02/2022 00:22        Scheduled Meds:  aspirin EC  81 mg Oral Daily   carvedilol  6.25 mg Oral BID WC   dexamethasone  6 mg Oral Q24H   enoxaparin (LOVENOX) injection  55 mg Subcutaneous Q24H   estradiol  0.5 mg Oral Daily   folic acid  1 mg Oral Daily   losartan  50 mg Oral Daily   multivitamin with minerals  1 tablet Oral Daily   nirmatrelvir/ritonavir  3 tablet Oral BID   pantoprazole  40 mg Oral Daily   sertraline  50 mg Oral Daily   sodium chloride flush  3 mL Intravenous Q12H   thiamine  100 mg Oral Daily   Continuous Infusions:   LOS: 1 day    Time spent: 40 minutes    Ramiro Harvest, MD [facility]   To contact the attending provider between 7A-7P or the covering provider during after hours 7P-7A, please log into the web site [email or web address] and access using universal [place] password for that web site. If you do not have the password, please call the hospital operator.  12/03/2022, 3:15 PM

## 2022-12-03 NOTE — Progress Notes (Signed)
Incentive spirometry introduced with teach back 

## 2022-12-04 ENCOUNTER — Other Ambulatory Visit (HOSPITAL_COMMUNITY): Payer: Self-pay

## 2022-12-04 DIAGNOSIS — K219 Gastro-esophageal reflux disease without esophagitis: Secondary | ICD-10-CM | POA: Diagnosis not present

## 2022-12-04 DIAGNOSIS — U071 COVID-19: Secondary | ICD-10-CM | POA: Diagnosis not present

## 2022-12-04 DIAGNOSIS — I10 Essential (primary) hypertension: Secondary | ICD-10-CM | POA: Diagnosis not present

## 2022-12-04 MED ORDER — ACETAMINOPHEN 325 MG PO TABS: 650.0000 mg | ORAL_TABLET | Freq: Four times a day (QID) | ORAL | Status: AC | PRN

## 2022-12-04 MED ORDER — LOSARTAN POTASSIUM 50 MG PO TABS
75.0000 mg | ORAL_TABLET | Freq: Every day | ORAL | Status: DC
  Administered 2022-12-04: 75 mg via ORAL
  Filled 2022-12-04: qty 2

## 2022-12-04 MED ORDER — HYDRALAZINE HCL 20 MG/ML IJ SOLN: 10.0000 mg | Freq: Once | INTRAMUSCULAR | Status: DC

## 2022-12-04 MED ORDER — IPRATROPIUM-ALBUTEROL 20-100 MCG/ACT IN AERS
INHALATION_SPRAY | RESPIRATORY_TRACT | 0 refills | Status: AC
  Filled 2022-12-04: qty 4, 14d supply, fill #0

## 2022-12-04 MED ORDER — DEXAMETHASONE 6 MG PO TABS
6.0000 mg | ORAL_TABLET | ORAL | 0 refills | Status: AC
  Filled 2022-12-04: qty 7, 7d supply, fill #0

## 2022-12-04 MED ORDER — NIRMATRELVIR/RITONAVIR (PAXLOVID)TABLET: 3.0000 | ORAL_TABLET | Freq: Two times a day (BID) | ORAL | Status: AC

## 2022-12-04 MED ORDER — LOSARTAN POTASSIUM 25 MG PO TABS
75.0000 mg | ORAL_TABLET | Freq: Every day | ORAL | 1 refills | Status: AC
  Filled 2022-12-04: qty 90, 30d supply, fill #0

## 2022-12-04 NOTE — Plan of Care (Signed)
Problem: Education: Goal: Knowledge of risk factors and measures for prevention of condition will improve 12/04/2022 1606 by Doyce Para, RN Outcome: Adequate for Discharge 12/04/2022 1606 by Doyce Para, RN Outcome: Adequate for Discharge   Problem: Coping: Goal: Psychosocial and spiritual needs will be supported 12/04/2022 1606 by Doyce Para, RN Outcome: Adequate for Discharge 12/04/2022 1606 by Doyce Para, RN Outcome: Adequate for Discharge   Problem: Respiratory: Goal: Will maintain a patent airway 12/04/2022 1606 by Doyce Para, RN Outcome: Adequate for Discharge 12/04/2022 1606 by Doyce Para, RN Outcome: Adequate for Discharge Goal: Complications related to the disease process, condition or treatment will be avoided or minimized 12/04/2022 1606 by Doyce Para, RN Outcome: Adequate for Discharge 12/04/2022 1606 by Doyce Para, RN Outcome: Adequate for Discharge   Problem: Education: Goal: Knowledge of General Education information will improve Description: Including pain rating scale, medication(s)/side effects and non-pharmacologic comfort measures 12/04/2022 1606 by Doyce Para, RN Outcome: Adequate for Discharge 12/04/2022 1606 by Doyce Para, RN Outcome: Adequate for Discharge   Problem: Health Behavior/Discharge Planning: Goal: Ability to manage health-related needs will improve 12/04/2022 1606 by Doyce Para, RN Outcome: Adequate for Discharge 12/04/2022 1606 by Doyce Para, RN Outcome: Adequate for Discharge   Problem: Clinical Measurements: Goal: Ability to maintain clinical measurements within normal limits will improve 12/04/2022 1606 by Doyce Para, RN Outcome: Adequate for Discharge 12/04/2022 1606 by Doyce Para, RN Outcome: Adequate for Discharge Goal: Will remain free from infection 12/04/2022 1606 by Doyce Para, RN Outcome: Adequate for  Discharge 12/04/2022 1606 by Doyce Para, RN Outcome: Adequate for Discharge Goal: Diagnostic test results will improve 12/04/2022 1606 by Doyce Para, RN Outcome: Adequate for Discharge 12/04/2022 1606 by Doyce Para, RN Outcome: Adequate for Discharge Goal: Respiratory complications will improve 12/04/2022 1606 by Doyce Para, RN Outcome: Adequate for Discharge 12/04/2022 1606 by Doyce Para, RN Outcome: Adequate for Discharge Goal: Cardiovascular complication will be avoided 12/04/2022 1606 by Doyce Para, RN Outcome: Adequate for Discharge 12/04/2022 1606 by Doyce Para, RN Outcome: Adequate for Discharge   Problem: Activity: Goal: Risk for activity intolerance will decrease 12/04/2022 1606 by Doyce Para, RN Outcome: Adequate for Discharge 12/04/2022 1606 by Doyce Para, RN Outcome: Adequate for Discharge   Problem: Nutrition: Goal: Adequate nutrition will be maintained 12/04/2022 1606 by Doyce Para, RN Outcome: Adequate for Discharge 12/04/2022 1606 by Doyce Para, RN Outcome: Adequate for Discharge   Problem: Coping: Goal: Level of anxiety will decrease 12/04/2022 1606 by Doyce Para, RN Outcome: Adequate for Discharge 12/04/2022 1606 by Doyce Para, RN Outcome: Adequate for Discharge   Problem: Elimination: Goal: Will not experience complications related to bowel motility 12/04/2022 1606 by Doyce Para, RN Outcome: Adequate for Discharge 12/04/2022 1606 by Doyce Para, RN Outcome: Adequate for Discharge Goal: Will not experience complications related to urinary retention 12/04/2022 1606 by Doyce Para, RN Outcome: Adequate for Discharge 12/04/2022 1606 by Doyce Para, RN Outcome: Adequate for Discharge   Problem: Pain Managment: Goal: General experience of comfort will improve 12/04/2022 1606 by Doyce Para, RN Outcome: Adequate for  Discharge 12/04/2022 1606 by Doyce Para, RN Outcome: Adequate for Discharge   Problem: Safety: Goal: Ability to remain free from injury will improve 12/04/2022 1606 by Doyce Para, RN Outcome: Adequate for Discharge 12/04/2022 1606 by Doyce Para, RN Outcome: Adequate for Discharge   Problem: Skin Integrity: Goal: Risk for impaired skin integrity will decrease 12/04/2022 1606 by Doyce Para, RN Outcome: Adequate for Discharge 12/04/2022 1606 by Doyce Para, RN Outcome: Adequate for  Discharge

## 2022-12-04 NOTE — Discharge Summary (Signed)
Physician Discharge Summary  Lynn Hodges:[id number] DOB: 1960-12-05 DOA: 12/01/2022  PCP: Heron Nay, PA  Admit date: 12/01/2022 Discharge date: 12/04/2022  Time spent: 60 minutes  Recommendations for Outpatient Follow-up:  Follow-up with Heron Nay, PA in 2 weeks.  On follow-up patient's blood pressure need to be reassessed as patient's losartan was increased to 75 mg daily.  Patient will need a basic metabolic profile done to follow-up on electrolytes and renal function.  Patient will need follow-up on indeterminate 1.5 cm adrenal nodule as stated below.   Discharge Diagnoses:  Principal Problem:   Acute respiratory failure due to COVID-19 [facility]) Active Problems:   Atherosclerotic heart disease of native coronary artery without angina pectoris   Class 3 severe obesity due to excess calories with serious comorbidity and body mass index (BMI) of 40.0 to 44.9 in adult [facility])   Dyslipidemia   Essential hypertension   Gastroesophageal reflux disease without esophagitis   Generalized anxiety disorder   Presence of coronary angioplasty implant and graft   Discharge Condition: Stable and improved  Diet recommendation: Heart healthy  Filed Weights   12/01/22 2333 12/03/22 0657 12/04/22 0500  Weight: 113.4 kg 115.4 kg 116.7 kg    History of present illness:  HPI per Dr. Rolanda Lundborg Lynn Hodges is a 62 y.o. female with medical history significant of CAD status post stent, hypertension, hyperlipidemia, GERD, anxiety, cervical spine disease, chronic pain, obesity, gout presenting with fever.   Patient reports ongoing fevers for the past couple days with associated body aches, congestion, cough productive of clear sputum, headaches, chills.  Patient had tried Tylenol at home without relief.  Fevers as high as 103 at home.  No recent travel or sick contacts known.  Denies shortness of breath.   Further denies chest pain, abdominal pain, constipation, diarrhea, nausea,  vomiting.   ED Course: Vital signs in the ED notable for fever to one 2.6, blood pressure in the 130s to 160s systolic, respiratory rate in the 20s, saturating in the upper 80s on room air requiring 2 L to maintain saturations.  Lab workup included CMP with glucose 127 and 134 and repeat in the morning, creatinine stable and remained stable in the morning.  CBC with mild leukocytosis to 10.9 which remained stable at 11.1 and repeat in the morning.  PT and INR normal.  Lactic acid normal.  Troponin negative x 2.  CRP 3.8.  LDH normal.  D-dimer 2.06.  COVID screen positive.  Chest x-ray with mild bilateral airspace disease at bases with small left pleural effusion suspected.  CT PE study was negative for PE and showed platelike atelectasis versus scarring with no obvious consolidations.  Adrenal nodule also noted.  Patient received Paxlovid, Decadron, Zofran, Coreg, vitamins, liter fluids, albuterol, oxycodone prior to transfer.  Hospital Course:  #1 acute respiratory failure with hypoxia secondary to COVID-19 infection -Patient presented with flulike symptoms of myalgias, fevers with temps as high as 103, chills, congestion, cough productive of clear sputum. -Patient noted to be hypoxic on room air with sats of 80% on room air on presentation with improvement on 2 L nasal cannula. -COVID-19 PCR positive. Influenza A and B PCR negative.  RSV by PCR negative. -Patient admitted, placed on Decadron, Paxlovid, scheduled Combivent, pulmonary toileting. -CT angiogram chest which was obtained was negative for PE, noted patchy areas of linear, platelike atelectasis and/or scarring throughout bilateral lungs.  Indeterminate 1.5 cm left adrenal nodule. -Patient improved clinically during the hospitalization, and by  day of discharge patient was satting 99% on room air. -Inflammatory markers which were elevated on admission trended down during the hospitalization. -Patient be discharged in stable and improved  condition. -Patient be discharged to complete course of Paxlovid as well as 7 days of Decadron to complete a 10-day course of Decadron. -Outpatient follow-up with PCP.   2.  CAD/hyperlipidemia -Prior history of stent placement. -Patient was maintained on home regimen aspirin, carvedilol, losartan. -On Repatha in the outpatient setting.   3.  GERD -Patient maintained on home regimen PPI.   4.  Anxiety -Patient was maintained on home regimen sertraline.    5.  History of cervical spinal disease/chronic pain -Oxycodone as needed.   6.  Severe Obesity -BMI 41.06 kg/m -Lifestyle modification -Outpatient follow-up with PCP.  7.  Hypertension -Patient placed back on home regimen of carvedilol and losartan. -Patient noted to have elevated blood pressures during the hospitalization.  Patient did state whenever she is on steroids blood pressure does get elevated. -Patient's losartan was increased to 75 mg daily during the hospitalization until outpatient follow-up with PCP.  8.  Indeterminate 1.5 cm left adrenal nodule -Noted as an incidental finding on CT angiogram chest, recommendations were for follow-up MRI abdomen versus adrenal protocol CT in 1 year and if stable in 1 year no further follow-up imaging will be needed. -Outpatient follow-up with PCP.  Procedures: CT angiogram chest 12/02/2022 Chest x-ray 12/01/2022  Consultations: None  Discharge Exam: Vitals:   12/04/22 0625 12/04/22 0817  BP: (!) 177/86 (!) 178/94  Pulse: (!) 52 60  Resp:  20  Temp: 98.6 F (37 C) (!) 97.4 F (36.3 C)  SpO2: 100% 99%    General: NAD Cardiovascular: RRR no murmurs rubs or gallops.  No JVD.  No lower extremity edema. Respiratory: Clear to auscultation bilaterally.  No wheezes, no crackles, no rhonchi.  Fair air movement.  Speaking in full sentences.  Discharge Instructions   Discharge Instructions     Diet - low sodium heart healthy   Complete by: As directed    Discharge  instructions   Complete by: As directed    PLEASE quarantine for 7 days.  ?   Person Under Monitoring Name: Lynn Hodges  Location: [address] [place] [address]   Infection Prevention Recommendations for Individuals Confirmed to have, or Being Evaluated for, 2019 Novel Coronavirus (COVID-19) Infection Who Receive Care at Home  Individuals who are confirmed to have, or are being evaluated for, COVID-19 should follow the prevention steps below until a healthcare provider or local or state health department says they can return to normal activities.  Stay home except to get medical care You should restrict activities outside your home, except for getting medical care. Do not go to work, school, or public areas, and do not use public transportation or taxis.  Call ahead before visiting your doctor Before your medical appointment, call the healthcare provider and tell them that you have, or are being evaluated for, COVID-19 infection. This will help the healthcare provider's office take steps to keep other people from getting infected. Ask your healthcare provider to call the local or state health department.  Monitor your symptoms Seek prompt medical attention if your illness is worsening (e.g., difficulty breathing). Before going to your medical appointment, call the healthcare provider and tell them that you have, or are being evaluated for, COVID-19 infection. Ask your healthcare provider to call the local or state health department.  Wear a facemask  You should wear a facemask that covers your nose and mouth when you are in the same room with other people and when you visit a healthcare provider. People who live with or visit you should also wear a facemask while they are in the same room with you.  Separate yourself from other people in your home As much as possible, you should stay in a different room from other people in your home. Also, you should use a  separate bathroom, if available.  Avoid sharing household items You should not share dishes, drinking glasses, cups, eating utensils, towels, bedding, or other items with other people in your home. After using these items, you should wash them thoroughly with soap and water.  Cover your coughs and sneezes Cover your mouth and nose with a tissue when you cough or sneeze, or you can cough or sneeze into your sleeve. Throw used tissues in a lined trash can, and immediately wash your hands with soap and water for at least 20 seconds or use an alcohol-based hand rub.  Wash your [employer] your hands often and thoroughly with soap and water for at least 20 seconds. You can use an alcohol-based hand sanitizer if soap and water are not available and if your hands are not visibly dirty. Avoid touching your eyes, nose, and mouth with unwashed hands.   Prevention Steps for Caregivers and Household Members of Individuals Confirmed to have, or Being Evaluated for, COVID-19 Infection Being Cared for in the Home  If you live with, or provide care at home for, a person confirmed to have, or being evaluated for, COVID-19 infection please follow these guidelines to prevent infection:  Follow healthcare provider's instructions Make sure that you understand and can help the patient follow any healthcare provider instructions for all care.  Provide for the patient's basic needs You should help the patient with basic needs in the home and provide support for getting groceries, prescriptions, and other personal needs.  Monitor the patient's symptoms If they are getting sicker, call his or her medical provider and tell them that the patient has, or is being evaluated for, COVID-19 infection. This will help the healthcare provider's office take steps to keep other people from getting infected. Ask the healthcare provider to call the local or state health department.  Limit the number of people who have  contact with the patient If possible, have only one caregiver for the patient. Other household members should stay in another home or place of residence. If this is not possible, they should stay in another room, or be separated from the patient as much as possible. Use a separate bathroom, if available. Restrict visitors who do not have an essential need to be in the home.  Keep older adults, very young children, and other sick people away from the patient Keep older adults, very young children, and those who have compromised immune systems or chronic health conditions away from the patient. This includes people with chronic heart, lung, or kidney conditions, diabetes, and cancer.  Ensure good ventilation Make sure that shared spaces in the home have good air flow, such as from an air conditioner or an opened window, weather permitting.  Wash your hands often Wash your hands often and thoroughly with soap and water for at least 20 seconds. You can use an alcohol based hand sanitizer if soap and water are not available and if your hands are not visibly dirty. Avoid touching your eyes, nose, and mouth with unwashed  hands. Use disposable paper towels to dry your hands. If not available, use dedicated cloth towels and replace them when they become wet.  Wear a facemask and gloves Wear a disposable facemask at all times in the room and gloves when you touch or have contact with the patient's blood, body fluids, and/or secretions or excretions, such as sweat, saliva, sputum, nasal mucus, vomit, urine, or feces.  Ensure the mask fits over your nose and mouth tightly, and do not touch it during use. Throw out disposable facemasks and gloves after using them. Do not reuse. Wash your hands immediately after removing your facemask and gloves. If your personal clothing becomes contaminated, carefully remove clothing and launder. Wash your hands after handling contaminated clothing. Place all used  disposable facemasks, gloves, and other waste in a lined container before disposing them with other household waste. Remove gloves and wash your hands immediately after handling these items.  Do not share dishes, glasses, or other household items with the patient Avoid sharing household items. You should not share dishes, drinking glasses, cups, eating utensils, towels, bedding, or other items with a patient who is confirmed to have, or being evaluated for, COVID-19 infection. After the person uses these items, you should wash them thoroughly with soap and water.  Wash laundry thoroughly Immediately remove and wash clothes or bedding that have blood, body fluids, and/or secretions or excretions, such as sweat, saliva, sputum, nasal mucus, vomit, urine, or feces, on them. Wear gloves when handling laundry from the patient. Read and follow directions on labels of laundry or clothing items and detergent. In general, wash and dry with the warmest temperatures recommended on the label.  Clean all areas the individual has used often Clean all touchable surfaces, such as counters, tabletops, doorknobs, bathroom fixtures, toilets, phones, keyboards, tablets, and bedside tables, every day. Also, clean any surfaces that may have blood, body fluids, and/or secretions or excretions on them. Wear gloves when cleaning surfaces the patient has come in contact with. Use a diluted bleach solution (e.g., dilute bleach with 1 part bleach and 10 parts water) or a household disinfectant with a label that says EPA-registered for coronaviruses. To make a bleach solution at home, add 1 tablespoon of bleach to 1 quart (4 cups) of water. For a larger supply, add  cup of bleach to 1 gallon (16 cups) of water. Read labels of cleaning products and follow recommendations provided on product labels. Labels contain instructions for safe and effective use of the cleaning product including precautions you should take when applying  the product, such as wearing gloves or eye protection and making sure you have good ventilation during use of the product. Remove gloves and wash hands immediately after cleaning.  Monitor yourself for signs and symptoms of illness Caregivers and household members are considered close contacts, should monitor their health, and will be asked to limit movement outside of the home to the extent possible. Follow the monitoring steps for close contacts listed on the symptom monitoring form.   ? If you have additional questions, contact your local health department or call the epidemiologist on call at [phone] (available 24/7). ? This guidance is subject to change. For the most up-to-date guidance from CDC, please refer to their website: TripMetro.hu   Increase activity slowly   Complete by: As directed       Allergies as of 12/04/2022       Reactions   Anesthetics, Halogenated Other (See Comments)   Family history (siblings) of  MALIGNANT HYPERTHERMIA, patient has had IV Anesthesia in the past, but no gas.   Ezetimibe Itching   Statins Itching, Rash, Shortness Of Breath   Succinylcholine Other (See Comments)   Family history (siblings) of MALIGNANT HYPERTHERMIA. Patient has had IV sedation but no gas.   Penicillins Nausea And Vomiting, Nausea Only, Rash, Other (See Comments)   GI   Allopurinol Nausea Only, Other (See Comments)   GI upset   Lisinopril Cough        Medication List     STOP taking these medications    acetaminophen-codeine 300-30 MG tablet Commonly known as: TYLENOL #3       TAKE these medications    acetaminophen 325 MG tablet Commonly known as: TYLENOL Take 2 tablets (650 mg total) by mouth every 6 (six) hours as needed for mild pain, fever or headache.   aspirin EC 81 MG tablet Take 81 mg by mouth daily.   carvedilol 6.25 MG tablet Commonly known as: COREG Take 6.25 mg by mouth 2  (two) times daily.   cholecalciferol 1000 units tablet Commonly known as: VITAMIN D Take 2,000 Units by mouth daily.   dexamethasone 6 MG tablet Commonly known as: DECADRON Take 1 tablet (6 mg total) by mouth daily for 7 days. Start taking on: December 05, 2022   estradiol 0.5 MG tablet Commonly known as: ESTRACE Take 0.5 mg by mouth daily.   Ipratropium-Albuterol 20-100 MCG/ACT Aers respimat Commonly known as: COMBIVENT Inhale 2 puffs into the lungs every 6 (six) hours for 7 days, THEN 2 puffs every 6 (six) hours as needed for up to 7 days. Start taking on: December 04, 2022   Linzess 290 MCG Caps capsule Generic drug: linaclotide Take 290 mcg by mouth as needed (constipation).   losartan 25 MG tablet Commonly known as: COZAAR Take 3 tablets (75 mg total) by mouth daily. What changed:  medication strength how much to take   nirmatrelvir/ritonavir 20 x 150 MG & 10 x 100MG  Tabs Commonly known as: PAXLOVID Take 3 tablets by mouth 2 (two) times daily for 5 days. Patient GFR is > 60 Take nirmatrelvir (150 mg) two tablets twice daily for 5 days and ritonavir (100 mg) one tablet twice daily for 5 days.   nitroGLYCERIN 0.4 MG SL tablet Commonly known as: NITROSTAT Place 0.4 mg under the tongue every 5 (five) minutes as needed for chest pain.   pantoprazole 40 MG tablet Commonly known as: PROTONIX Take 40 mg by mouth daily.   Repatha SureClick 140 MG/ML Soaj Generic drug: Evolocumab Inject 140 mg into the skin every 14 (fourteen) days.   sertraline 50 MG tablet Commonly known as: ZOLOFT Take 50 mg by mouth daily.       Allergies  Allergen Reactions   Anesthetics, Halogenated Other (See Comments)    Family history (siblings) of MALIGNANT HYPERTHERMIA, patient has had IV Anesthesia in the past, but no gas.   Ezetimibe Itching   Statins Itching, Rash and Shortness Of Breath   Succinylcholine Other (See Comments)    Family history (siblings) of MALIGNANT HYPERTHERMIA.  Patient has had IV sedation but no gas.   Penicillins Nausea And Vomiting, Nausea Only, Rash and Other (See Comments)    GI   Allopurinol Nausea Only and Other (See Comments)    GI upset   Lisinopril Cough    Follow-up Information     Heron Nay, PA. Schedule an appointment as soon as possible for a visit in 2 week(s).  Specialty: Physician Assistant Contact information: [address] [place] [address] [phone]                  The results of significant diagnostics from this hospitalization (including imaging, microbiology, ancillary and laboratory) are listed below for reference.    Significant Diagnostic Studies: CT Angio Chest PE W and/or Wo Contrast  Result Date: 12/02/2022 CLINICAL DATA:  Pulmonary embolism (PE) suspected, high prob EXAM: CT ANGIOGRAPHY CHEST WITH CONTRAST TECHNIQUE: Multidetector CT imaging of the chest was performed using the standard protocol during bolus administration of intravenous contrast. Multiplanar CT image reconstructions and MIPs were obtained to evaluate the vascular anatomy. RADIATION DOSE REDUCTION: This exam was performed according to the departmental dose-optimization program which includes automated exposure control, adjustment of the mA and/or kV according to patient size and/or use of iterative reconstruction technique. CONTRAST:  OMNIPAQUE IOHEXOL 350 MG/ML SOLN COMPARISON:  None Available. FINDINGS: Cardiovascular: No evidence of embolism to the proximal subsegmental pulmonary artery level. Normal cardiac size. No pericardial effusion. No aortic aneurysm. There are coronary artery calcifications, in keeping with coronary artery disease. There is a stent in the proximal LAD. Mediastinum/Nodes: Visualized thyroid gland appears grossly unremarkable. No solid / cystic mediastinal masses. The esophagus is nondistended precluding optimal assessment. No axillary, mediastinal or hilar lymphadenopathy by size criteria.  Lungs/Pleura: The central tracheo-bronchial tree is patent. There are patchy areas of linear, plate-like atelectasis and/or scarring throughout bilateral lungs. No mass or consolidation. No pleural effusion or pneumothorax. No suspicious lung nodules. Upper Abdomen: There is a 1.1 x 1.5 cm left adrenal adenoma with internal CT attenuation of 30-35 Hounsfield units, indeterminate on this exam. Remaining visualized upper abdominal viscera within normal limits. Musculoskeletal: The visualized soft tissues of the chest wall are grossly unremarkable. No suspicious osseous lesions. There are mild multilevel degenerative changes in the visualized spine. Review of the MIP images confirms the above findings. IMPRESSION: 1. No pulmonary embolism to the proximal subsegmental pulmonary artery level. 2. There are patchy areas of linear, plate-like atelectasis and/or scarring throughout bilateral lungs. 3. Indeterminate 1.5 cm left adrenal nodule. If prior imaging is available, comparison can be made to document stability. Otherwise, recommend follow-up MRI abdomen versus adrenal protocol CT in 1 year. If stable for ? 1 year, no further follow-up imaging. JACR 2017 Aug; 14(8):1038-44, JCAT 2016 Mar-Apr; 40(2):194-200, Urol J 2006 Spring; 3(2):71-4. 4. Multiple other nonacute observations, as described above. Electronically Signed   By: Jules Schick M.D.   On: 12/02/2022 10:43   DG Chest 2 View  Result Date: 12/02/2022 CLINICAL DATA:  Cough, fever, headache, and body aches. EXAM: CHEST - 2 VIEW COMPARISON:  10/29/2022. FINDINGS: The heart size and mediastinal contours are stable. Mild airspace disease is noted at the lung bases, greater on the left than on the right. There is blunting of the left costophrenic angle in the possibility of a small pleural effusion can not be excluded. No pneumothorax. Degenerative changes are noted in the thoracic spine. IMPRESSION: 1. Mild airspace disease at the lung bases bilaterally,  possible atelectasis or infiltrate. 2. Blunting of the left costophrenic angle, suggesting small effusion. Electronically Signed   By: Thornell Sartorius M.D.   On: 12/02/2022 00:22    Microbiology: Recent Results (from the past 240 hour(s))  Resp panel by RT-PCR (RSV, Flu A&B, Covid) Anterior Nasal Swab     Status: Abnormal   Collection Time: 12/01/22 11:50 PM   Specimen: Anterior Nasal Swab  Result  Value Ref Range Status   SARS Coronavirus 2 by RT PCR POSITIVE (A) NEGATIVE Final    Comment: (NOTE) SARS-CoV-2 target nucleic acids are DETECTED.  The SARS-CoV-2 RNA is generally detectable in upper respiratory specimens during the acute phase of infection. Positive results are indicative of the presence of the identified virus, but do not rule out bacterial infection or co-infection with other pathogens not detected by the test. Clinical correlation with patient history and other diagnostic information is necessary to determine patient infection status. The expected result is Negative.  Fact Sheet for Patients: BloggerCourse.com  Fact Sheet for Healthcare Providers: SeriousBroker.it  This test is not yet approved or cleared by the [place] FDA and  has been authorized for detection and/or diagnosis of SARS-CoV-2 by FDA under an Emergency Use Authorization (EUA).  This EUA will remain in effect (meaning this test can be used) for the duration of  the COVID-19 declaration under Section 564(b)(1) of the A ct, 21 U.S.C. section 360bbb-3(b)(1), unless the authorization is terminated or revoked sooner.     Influenza A by PCR NEGATIVE NEGATIVE Final   Influenza B by PCR NEGATIVE NEGATIVE Final    Comment: (NOTE) The Xpert Xpress SARS-CoV-2/FLU/RSV plus assay is intended as an aid in the diagnosis of influenza from Nasopharyngeal swab specimens and should not be used as a sole basis for treatment. Nasal washings and aspirates are  unacceptable for Xpert Xpress SARS-CoV-2/FLU/RSV testing.  Fact Sheet for Patients: BloggerCourse.com  Fact Sheet for Healthcare Providers: SeriousBroker.it  This test is not yet approved or cleared by the [place] FDA and has been authorized for detection and/or diagnosis of SARS-CoV-2 by FDA under an Emergency Use Authorization (EUA). This EUA will remain in effect (meaning this test can be used) for the duration of the COVID-19 declaration under Section 564(b)(1) of the Act, 21 U.S.C. section 360bbb-3(b)(1), unless the authorization is terminated or revoked.     Resp Syncytial Virus by PCR NEGATIVE NEGATIVE Final    Comment: (NOTE) Fact Sheet for Patients: BloggerCourse.com  Fact Sheet for Healthcare Providers: SeriousBroker.it  This test is not yet approved or cleared by the [place] FDA and has been authorized for detection and/or diagnosis of SARS-CoV-2 by FDA under an Emergency Use Authorization (EUA). This EUA will remain in effect (meaning this test can be used) for the duration of the COVID-19 declaration under Section 564(b)(1) of the Act, 21 U.S.C. section [id number])(1), unless the authorization is terminated or revoked.  Performed at [facility], [address] [place], [place] [address]      Labs: Basic Metabolic Panel: Recent Labs  Lab 12/01/22 2341 12/02/22 0544 12/03/22 0214 12/04/22 0651  NA 136 138 135 136  K 3.8 3.9 3.7 3.7  CL 103 107 105 105  CO2 23 22 21* 23  GLUCOSE 127* 134* 135* 126*  BUN 11 10 17 15   CREATININE 1.04* 1.01* 1.07* 1.00  CALCIUM 9.2 8.9 8.5* 9.2  MG  --   --   --  2.0   Liver Function Tests: Recent Labs  Lab 12/01/22 2341 12/02/22 0544 12/03/22 0214  AST 22 17 18   ALT 18 18 18   ALKPHOS 64 58 54  BILITOT 0.9 0.6 0.4  PROT 7.6 7.4 6.2*  ALBUMIN 3.8 3.6 3.2*   No results for input(s):  "LIPASE", "AMYLASE" in the last 168 hours. No results for input(s): "AMMONIA" in the last 168 hours. CBC: Recent Labs  Lab 12/01/22 2341 12/02/22 0544 12/03/22  0214 12/04/22 0651  WBC 10.9* 11.1* 10.3 13.3*  NEUTROABS 8.1* 9.5*  --  11.0*  HGB 14.5 14.0 12.4 13.3  HCT 43.8 42.3 39.3 40.7  MCV 84.2 84.6 89.3 84.6  PLT 232 216 186 243   Cardiac Enzymes: No results for input(s): "CKTOTAL", "CKMB", "CKMBINDEX", "TROPONINI" in the last 168 hours. BNP: BNP (last 3 results) No results for input(s): "BNP" in the last 8760 hours.  ProBNP (last 3 results) No results for input(s): "PROBNP" in the last 8760 hours.  CBG: No results for input(s): "GLUCAP" in the last 168 hours.     Signed:  Ramiro Harvest MD.  [facility] 12/04/2022, 3:18 PM

## 2022-12-04 NOTE — Progress Notes (Signed)
Transition of Care [facility] - Inpatient Brief Assessment   Patient Details  Name: Lynn Hodges MRN: [id number] Date of Birth: [birth year]  [facility] CM/SW Contact:    Janae Bridgeman, RN Phone Number: 12/04/2022, 4:43 PM   Clinical Narrative: Patient is discharging home today - No TOC needs at this time.   Transition of Care Asessment: Insurance and Status: (P) Insurance coverage has been reviewed Patient has primary care physician: (P) Yes Home environment has been reviewed: (P) Yes Prior level of function:: (P) Independent Prior/Current Home Services: (P) No current home services Social Determinants of Health Reivew: (P) SDOH reviewed no interventions necessary Readmission risk has been reviewed: (P) Yes Transition of care needs: (P) no transition of care needs at this time

## 2023-11-30 ENCOUNTER — Other Ambulatory Visit (HOSPITAL_COMMUNITY): Payer: Self-pay
